# Patient Record
Sex: Female | Born: 1975
Health system: Southern US, Community
[De-identification: ages and names within clinical notes are randomized; demographics above are authoritative.]

## PROBLEM LIST (undated history)

## (undated) DIAGNOSIS — B019 Varicella without complication: Secondary | ICD-10-CM

## (undated) DIAGNOSIS — R1011 Right upper quadrant pain: Principal | ICD-10-CM

## (undated) DIAGNOSIS — G40909 Epilepsy, unspecified, not intractable, without status epilepticus: Secondary | ICD-10-CM

## (undated) DIAGNOSIS — K30 Functional dyspepsia: Secondary | ICD-10-CM

## (undated) DIAGNOSIS — R011 Cardiac murmur, unspecified: Secondary | ICD-10-CM

## (undated) DIAGNOSIS — G4700 Insomnia, unspecified: Secondary | ICD-10-CM

## (undated) DIAGNOSIS — K219 Gastro-esophageal reflux disease without esophagitis: Secondary | ICD-10-CM

## (undated) DIAGNOSIS — E785 Hyperlipidemia, unspecified: Secondary | ICD-10-CM

## (undated) DIAGNOSIS — J40 Bronchitis, not specified as acute or chronic: Principal | ICD-10-CM

## (undated) DIAGNOSIS — J45901 Unspecified asthma with (acute) exacerbation: Secondary | ICD-10-CM

## (undated) DIAGNOSIS — E663 Overweight: Secondary | ICD-10-CM

## (undated) DIAGNOSIS — B083 Erythema infectiosum [fifth disease]: Secondary | ICD-10-CM

## (undated) DIAGNOSIS — Z9889 Other specified postprocedural states: Secondary | ICD-10-CM

## (undated) DIAGNOSIS — F419 Anxiety disorder, unspecified: Secondary | ICD-10-CM

## (undated) HISTORY — DX: Anxiety disorder, unspecified: F41.9

## (undated) HISTORY — DX: Hyperlipidemia, unspecified: E78.5

## (undated) HISTORY — DX: Insomnia, unspecified: G47.00

## (undated) HISTORY — DX: Erythema infectiosum (fifth disease): B08.3

## (undated) HISTORY — DX: Gastro-esophageal reflux disease without esophagitis: K21.9

## (undated) HISTORY — PX: MYRINGOTOMY: SUR874

## (undated) HISTORY — DX: Right upper quadrant pain: R10.11

## (undated) HISTORY — DX: Cardiac murmur, unspecified: R01.1

## (undated) HISTORY — DX: Functional dyspepsia: K30

## (undated) HISTORY — DX: Overweight: E66.3

## (undated) HISTORY — DX: Other specified postprocedural states: Z98.890

## (undated) HISTORY — DX: Bronchitis, not specified as acute or chronic: J40

## (undated) HISTORY — PX: DILATION AND CURETTAGE OF UTERUS: SHX78

## (undated) HISTORY — DX: Epilepsy, unspecified, not intractable, without status epilepticus: G40.909

## (undated) HISTORY — PX: LIVER SURGERY: SHX698

## (undated) HISTORY — DX: Unspecified asthma with (acute) exacerbation: J45.901

## (undated) HISTORY — DX: Varicella without complication: B01.9

---
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
## 2008-01-30 ENCOUNTER — Encounter (INDEPENDENT_AMBULATORY_CARE_PROVIDER_SITE_OTHER): Payer: Self-pay | Admitting: *Deleted

## 2008-02-10 LAB — CONVERTED CEMR LAB: Pap Smear: NORMAL

## 2008-03-12 ENCOUNTER — Ambulatory Visit: Payer: Self-pay | Admitting: *Deleted

## 2008-03-12 DIAGNOSIS — J0110 Acute frontal sinusitis, unspecified: Secondary | ICD-10-CM

## 2008-03-12 HISTORY — DX: Acute frontal sinusitis, unspecified: J01.10

## 2008-05-02 ENCOUNTER — Emergency Department (HOSPITAL_BASED_OUTPATIENT_CLINIC_OR_DEPARTMENT_OTHER): Admission: EM | Admit: 2008-05-02 | Discharge: 2008-05-02 | Payer: Self-pay | Admitting: Emergency Medicine

## 2008-05-27 ENCOUNTER — Ambulatory Visit: Payer: Self-pay | Admitting: *Deleted

## 2008-05-27 DIAGNOSIS — S3210XA Unspecified fracture of sacrum, initial encounter for closed fracture: Secondary | ICD-10-CM | POA: Insufficient documentation

## 2008-05-27 DIAGNOSIS — S322XXA Fracture of coccyx, initial encounter for closed fracture: Secondary | ICD-10-CM

## 2008-05-27 HISTORY — DX: Unspecified fracture of sacrum, initial encounter for closed fracture: S32.10XA

## 2008-05-28 ENCOUNTER — Telehealth (INDEPENDENT_AMBULATORY_CARE_PROVIDER_SITE_OTHER): Payer: Self-pay | Admitting: *Deleted

## 2008-06-29 ENCOUNTER — Telehealth (INDEPENDENT_AMBULATORY_CARE_PROVIDER_SITE_OTHER): Payer: Self-pay | Admitting: *Deleted

## 2008-07-02 ENCOUNTER — Ambulatory Visit: Payer: Self-pay | Admitting: *Deleted

## 2008-08-18 ENCOUNTER — Ambulatory Visit: Payer: Self-pay | Admitting: *Deleted

## 2008-08-18 ENCOUNTER — Telehealth (INDEPENDENT_AMBULATORY_CARE_PROVIDER_SITE_OTHER): Payer: Self-pay | Admitting: *Deleted

## 2008-08-19 DIAGNOSIS — J45901 Unspecified asthma with (acute) exacerbation: Secondary | ICD-10-CM | POA: Insufficient documentation

## 2008-08-19 HISTORY — DX: Unspecified asthma with (acute) exacerbation: J45.901

## 2008-09-06 ENCOUNTER — Telehealth (INDEPENDENT_AMBULATORY_CARE_PROVIDER_SITE_OTHER): Payer: Self-pay | Admitting: *Deleted

## 2008-09-08 ENCOUNTER — Ambulatory Visit: Payer: Self-pay | Admitting: *Deleted

## 2008-09-08 ENCOUNTER — Ambulatory Visit: Payer: Self-pay | Admitting: Diagnostic Radiology

## 2008-09-08 ENCOUNTER — Ambulatory Visit (HOSPITAL_BASED_OUTPATIENT_CLINIC_OR_DEPARTMENT_OTHER): Admission: RE | Admit: 2008-09-08 | Discharge: 2008-09-08 | Payer: Self-pay | Admitting: *Deleted

## 2008-09-08 DIAGNOSIS — J309 Allergic rhinitis, unspecified: Secondary | ICD-10-CM | POA: Insufficient documentation

## 2008-10-25 ENCOUNTER — Ambulatory Visit: Payer: Self-pay | Admitting: *Deleted

## 2008-10-25 DIAGNOSIS — G44219 Episodic tension-type headache, not intractable: Secondary | ICD-10-CM

## 2008-10-25 HISTORY — DX: Episodic tension-type headache, not intractable: G44.219

## 2009-01-03 ENCOUNTER — Telehealth: Payer: Self-pay | Admitting: Family Medicine

## 2009-04-07 ENCOUNTER — Ambulatory Visit: Payer: Self-pay | Admitting: Family Medicine

## 2009-04-07 DIAGNOSIS — N3000 Acute cystitis without hematuria: Secondary | ICD-10-CM | POA: Insufficient documentation

## 2009-04-07 DIAGNOSIS — R002 Palpitations: Secondary | ICD-10-CM

## 2009-04-07 HISTORY — DX: Acute cystitis without hematuria: N30.00

## 2009-04-07 HISTORY — DX: Palpitations: R00.2

## 2009-04-07 LAB — CONVERTED CEMR LAB
Ketones, urine, test strip: NEGATIVE
Nitrite: NEGATIVE
Protein, U semiquant: NEGATIVE
Urobilinogen, UA: 0.2
WBC Urine, dipstick: NEGATIVE
pH: 5.5

## 2009-04-21 ENCOUNTER — Ambulatory Visit: Admission: RE | Admit: 2009-04-21 | Discharge: 2009-04-21 | Payer: Self-pay | Admitting: Family Medicine

## 2009-04-21 ENCOUNTER — Encounter: Payer: Self-pay | Admitting: Family Medicine

## 2009-06-24 ENCOUNTER — Ambulatory Visit: Payer: Self-pay | Admitting: Family Medicine

## 2009-06-24 LAB — CONVERTED CEMR LAB: Rapid Strep: NEGATIVE

## 2009-09-16 ENCOUNTER — Telehealth: Payer: Self-pay | Admitting: Family Medicine

## 2009-11-29 ENCOUNTER — Ambulatory Visit: Payer: Self-pay | Admitting: Family Medicine

## 2009-11-29 DIAGNOSIS — J069 Acute upper respiratory infection, unspecified: Secondary | ICD-10-CM | POA: Insufficient documentation

## 2009-12-08 ENCOUNTER — Emergency Department (HOSPITAL_COMMUNITY): Admission: EM | Admit: 2009-12-08 | Discharge: 2009-12-08 | Payer: Self-pay | Admitting: Emergency Medicine

## 2010-02-09 ENCOUNTER — Encounter: Payer: Self-pay | Admitting: Family Medicine

## 2010-02-10 HISTORY — PX: ANKLE SURGERY: SHX546

## 2010-03-08 ENCOUNTER — Encounter: Payer: Self-pay | Admitting: Family Medicine

## 2010-05-24 ENCOUNTER — Emergency Department (HOSPITAL_COMMUNITY): Admission: EM | Admit: 2010-05-24 | Discharge: 2010-05-24 | Payer: Self-pay | Admitting: Emergency Medicine

## 2010-08-10 ENCOUNTER — Telehealth: Payer: Self-pay | Admitting: Family Medicine

## 2010-08-10 ENCOUNTER — Encounter: Payer: Self-pay | Admitting: Family Medicine

## 2010-09-12 NOTE — Letter (Signed)
Summary: [facility]  [facility]   Imported By: Lanelle Bal 02/24/2010 12:47:59  _____________________________________________________________________  External Attachment:    Type:   Image     Comment:   External Document

## 2010-09-12 NOTE — Op Note (Signed)
Summary: Ankle Arthroscopy/[facility]  Ankle Arthroscopy/[facility]   Imported By: Lanelle Bal 03/17/2010 11:12:20  _____________________________________________________________________  External Attachment:    Type:   Image     Comment:   External Document

## 2010-09-12 NOTE — Progress Notes (Signed)
Summary: refill request CYCLOBENZAPRINE  Phone Note Refill Request Message from:  Fax from Pharmacy on September 16, 2009 8:27 AM  Refills Requested: Medication #1:  cyclobenzaprine 10 mg tablet  #20   Dosage confirmed as above?Dosage Confirmed   Brand Name Necessary? No   Supply Requested: 1 month   Last Refilled: 12/03/2008  Method Requested: Electronic Next Appointment Scheduled: none Initial call taken by: Roselle Locus,  September 16, 2009 8:29 AM  Follow-up for Phone Call        denied.  Needs OV with Dr Cathey Endow. Follow-up by: Seymour Bars DO,  September 17, 2009 11:13 AM

## 2010-09-12 NOTE — Assessment & Plan Note (Signed)
Summary: POSS SINUS INFECTION/TJ   Vital Signs:  Patient Profile:   35 Years Old Female CC:      Sinus pain and pressure in eye and left ear, coughing up green x 4 days Height:     62 inches Weight:      198 pounds O2 Sat:      98 % O2 treatment:    Room Air Temp:     98.7 degrees F oral Pulse rate:   78 / minute Pulse rhythm:   regular Resp:     16 per minute BP sitting:   126 / 82  (right arm) Cuff size:   regular  Vitals Entered By: Emilio Math (November 29, 2009 5:01 PM)                  Current Allergies: No known allergies History of Present Illness Chief Complaint: Sinus pain and pressure in eye and left ear, coughing up green x 4 days History of Present Illness: Subjective: Patient complains of URI symptoms for 3 days.  She has a history of asthma.  Patient requests refill of Flexeril which takes occasional for sore muscles after riding horses. No sore throat + cough No pleuritic pain No wheezing + nasal congestion + post-nasal drainage No sinus pain/pressure No itchy/red eyes No earache No hemoptysis No SOB but chest feels tight. No fever, + chills No nausea No vomiting No abdominal pain No diarrhea No skin rashes + fatigue No myalgias No headache    Current Meds ZOVIA 1/35E (28) 1-35 MG-MCG  TABS (ETHYNODIOL DIAC-ETH ESTRADIOL) Take 1 tablet by mouth once a day COMPLETE WOMENS   TABS (MULTIPLE VITAMINS-MINERALS) Take 1 tablet by mouth once a day PROAIR HFA 108 (90 BASE) MCG/ACT AERS (ALBUTEROL SULFATE) 1-2 puffs every 3-4 hours as needed shortness of breath ADVIL COLD/SINUS 30-200 MG TABS (PSEUDOEPHEDRINE-IBUPROFEN)  SUDAFED 30 MG TABS (PSEUDOEPHEDRINE HCL)  MUCINEX DM 30-600 MG XR12H-TAB (DEXTROMETHORPHAN-GUAIFENESIN)  AZITHROMYCIN 250 MG TABS (AZITHROMYCIN) Two tabs by mouth on day 1, then 1 tab daily on days 2 through 5 PREDNISONE 10 MG TABS (PREDNISONE) 2 PO today, then 2 BID for 2 days, then 1 two times a day for 2 days, then 1 daily for 2  days.  Take PC BENZONATATE 200 MG CAPS (BENZONATATE) One by mouth hs as needed cough CYCLOBENZAPRINE HCL 10 MG TABS (CYCLOBENZAPRINE HCL) One tab by mouth two to three times daily as needed  REVIEW OF SYSTEMS Constitutional Symptoms       Complains of fever, chills, and night sweats.     Denies weight loss, weight gain, and fatigue.  Eyes       Complains of eye pain.      Denies change in vision, eye discharge, glasses, contact lenses, and eye surgery. Ear/Nose/Throat/Mouth       Complains of ear pain, frequent runny nose, and sinus problems.      Denies hearing loss/aids, change in hearing, ear discharge, dizziness, frequent nose bleeds, sore throat, hoarseness, and tooth pain or bleeding.  Respiratory       Complains of productive cough.      Denies dry cough, wheezing, shortness of breath, asthma, bronchitis, and emphysema/COPD.  Cardiovascular       Complains of tires easily with exhertion.      Denies murmurs and chest pain.    Gastrointestinal       Denies stomach pain, nausea/vomiting, diarrhea, constipation, blood in bowel movements, and indigestion. Genitourniary  Denies painful urination, kidney stones, and loss of urinary control. Neurological       Complains of headaches.      Denies paralysis, seizures, and fainting/blackouts. Musculoskeletal       Complains of muscle pain and joint pain.      Denies joint stiffness, decreased range of motion, redness, swelling, muscle weakness, and gout.  Skin       Denies bruising, unusual mles/lumps or sores, and hair/skin or nail changes.  Psych       Denies mood changes, temper/anger issues, anxiety/stress, speech problems, depression, and sleep problems.  Past History:  Past Medical History: Reviewed history from 04/07/2009 and no changes required. Hx of  Depression Stress / tension headache - uses chiropracter with good relief - rare use of flexeril hyperlipidemia - diet controlled past h/o seizures - completely cleared   since 1994 - on no meds FRACTURE, COCCYX (ICD-805.6) Asthma Allergic rhinitis  Past Surgical History: Reviewed history from 03/12/2008 and no changes required. RMSF 12/2005 tonsils / adenoids 1996  Family History: Reviewed history from 04/07/2009 and no changes required. mother high chol, depression, PTSD, melanoma father high chol aunt high chol  only child aunt died of AMI at 13.  Social History: Reviewed history from 04/07/2009 and no changes required. Occupation: patient is an Charity fundraiser in Haematologist at [employer] Married to [place].  Has a 45 yo daughter [place]. Never smoked. 1 ETOH/ wk. Has a farm.  Rides horses and runs 4 x a wk. Pre - preg wt 165.     Objective:  No acute distress  Eyes:  Pupils are equal, round, and reactive to light and accomdation.  Extraocular movement is intact.  Conjunctivae are not inflamed.  Ears:  Canals normal.  Tympanic membranes normal.   Nose:  Normal septum.  Normal turbinates, mildly congested.  No sinus tenderness present.  Pharynx:  Normal  Neck:  Supple.  No adenopathy is present.  No thyromegaly is present  Lungs:  Clear to auscultation.  Breath sounds are equal.  Heart:  Regular rate and rhythm without murmurs, rubs, or gallops.  Abdomen:  Nontender without masses or hepatosplenomegaly.  Bowel sounds are present.  No CVA or flank tenderness.  Skin:  No rash Assessment New Problems: URI (ICD-465.9)  VIRAL URI WITH EARLY BRONCHOSPASM  Plan New Medications/Changes: CYCLOBENZAPRINE HCL 10 MG TABS (CYCLOBENZAPRINE HCL) One tab by mouth two to three times daily as needed  #20 x 0, 11/29/2009, Donna Christen MD BENZONATATE 200 MG CAPS (BENZONATATE) One by mouth hs as needed cough  #12 x 0, 11/29/2009, Donna Christen MD PREDNISONE 10 MG TABS (PREDNISONE) 2 PO today, then 2 BID for 2 days, then 1 two times a day for 2 days, then 1 daily for 2 days.  Take PC  #16 x 0, 11/29/2009, Donna Christen MD AZITHROMYCIN 250 MG TABS (AZITHROMYCIN) Two tabs by  mouth on day 1, then 1 tab daily on days 2 through 5  #6 tabs x 0, 11/29/2009, Donna Christen MD  New Orders: Est. Patient Level III [phone] Planning Comments:   Begin prednisone taper, Z-pack, expectorant, cough suppressant at night.  May continue ProAir as needed. Follow-up with PCP for persistent symptoms  Refill Flexeril   The patient and/or caregiver has been counseled thoroughly with regard to medications prescribed including dosage, schedule, interactions, rationale for use, and possible side effects and they verbalize understanding.  Diagnoses and expected course of recovery discussed and will return if not improved as expected or if the  condition worsens. Patient and/or caregiver verbalized understanding.  Prescriptions: CYCLOBENZAPRINE HCL 10 MG TABS (CYCLOBENZAPRINE HCL) One tab by mouth two to three times daily as needed  #20 x 0   Entered and Authorized by:   Donna Christen MD   Signed by:   Donna Christen MD on 11/29/2009   Method used:   Print then Give to Patient   RxID:   [id number] BENZONATATE 200 MG CAPS (BENZONATATE) One by mouth hs as needed cough  #12 x 0   Entered and Authorized by:   Donna Christen MD   Signed by:   Donna Christen MD on 11/29/2009   Method used:   Print then Give to Patient   RxID:   [id number] PREDNISONE 10 MG TABS (PREDNISONE) 2 PO today, then 2 BID for 2 days, then 1 two times a day for 2 days, then 1 daily for 2 days.  Take PC  #16 x 0   Entered and Authorized by:   Donna Christen MD   Signed by:   Donna Christen MD on 11/29/2009   Method used:   Print then Give to Patient   RxID:   [id number] AZITHROMYCIN 250 MG TABS (AZITHROMYCIN) Two tabs by mouth on day 1, then 1 tab daily on days 2 through 5  #6 tabs x 0   Entered and Authorized by:   Donna Christen MD   Signed by:   Donna Christen MD on 11/29/2009   Method used:   Print then Give to Patient   RxID:   [id number]   Patient Instructions: 1)  May use Mucinex D  (guaifenesin with decongestant) twice daily for congestion. 2)  Increase fluid intake  3)  Continue ProAir inhaler 4)  May use Afrin nasal spray (or generic oxymetazoline) twice daily for about 5 days.  Also recommend using saline nasal spray several times daily and/or saline nasal irrigation. 5)  Followup with family doctor if not improving 7 to 10 days.

## 2010-09-14 NOTE — Progress Notes (Signed)
Summary: Needs note to decline flu vaccine  Phone Note Call from Patient Call back at [phone]   Caller: Patient Call For: Ashley Bars DO Summary of Call: Pt calls and wants to know if should take flu shot. Required for work but when took last ear developed a large welp that was warm to touch and blistered out. Prefers not to take it but will need a note from you stating can not take the flu shot. Initial call taken by: Kathlene November LPN,  August 10, 2010 11:25 AM  Follow-up for Phone Call        letter printed. Follow-up by: Ashley Bars DO,  August 10, 2010 11:43 AM     Appended Document: Needs note to decline flu vaccine 08/10/2010- Pt notified can pick up note from office. KJ LPN

## 2010-09-14 NOTE — Letter (Signed)
Summary: Generic Letter  [facility]  [address], [address]   [place], [place] [address]   Phone: [phone]  Fax: [phone]    08/10/2010  Ashley Chang [address] [place], [place]  [address]  To Whom It May Concern,  Ms. Ashley Chang developed a localized reaction to the influenza vaccine in 2010 and should be exempt from administration of this year's flu shot for this reason.         Sincerely,    Seymour Bars DO

## 2010-09-18 ENCOUNTER — Other Ambulatory Visit: Payer: Self-pay | Admitting: Orthopedic Surgery

## 2010-09-18 ENCOUNTER — Ambulatory Visit
Admission: RE | Admit: 2010-09-18 | Discharge: 2010-09-18 | Disposition: A | Discharge status: Home / Self Care | Payer: Self-pay | Source: Ambulatory Visit | Attending: Orthopedic Surgery | Admitting: Orthopedic Surgery

## 2010-09-18 DIAGNOSIS — M25522 Pain in left elbow: Secondary | ICD-10-CM

## 2010-10-31 LAB — URINALYSIS, ROUTINE W REFLEX MICROSCOPIC
Bilirubin Urine: NEGATIVE
Ketones, ur: NEGATIVE mg/dL
Nitrite: NEGATIVE
Specific Gravity, Urine: 1.038 — ABNORMAL HIGH (ref 1.005–1.030)
Urobilinogen, UA: 0.2 mg/dL (ref 0.0–1.0)

## 2010-10-31 LAB — POCT I-STAT, CHEM 8
Calcium, Ion: 1.13 mmol/L (ref 1.12–1.32)
Creatinine, Ser: 1 mg/dL (ref 0.4–1.2)
Glucose, Bld: 83 mg/dL (ref 70–99)
HCT: 41 % (ref 36.0–46.0)
Hemoglobin: 13.9 g/dL (ref 12.0–15.0)
Potassium: 3.8 mEq/L (ref 3.5–5.1)
TCO2: 27 mmol/L (ref 0–100)

## 2010-10-31 LAB — POCT CARDIAC MARKERS
CKMB, poc: <1 ng/mL — ABNORMAL LOW (ref 1.0–8.0)
Myoglobin, poc: 47.5 ng/mL (ref 12–200)

## 2010-10-31 LAB — URINE MICROSCOPIC-ADD ON

## 2010-10-31 LAB — POCT PREGNANCY, URINE: Preg Test, Ur: NEGATIVE

## 2011-04-19 ENCOUNTER — Encounter: Payer: Self-pay | Admitting: Family Medicine

## 2011-04-19 ENCOUNTER — Other Ambulatory Visit: Payer: Self-pay | Admitting: Family Medicine

## 2011-04-19 ENCOUNTER — Ambulatory Visit (INDEPENDENT_AMBULATORY_CARE_PROVIDER_SITE_OTHER): Payer: BC Managed Care – PPO | Admitting: Family Medicine

## 2011-04-19 DIAGNOSIS — A779 Spotted fever, unspecified: Secondary | ICD-10-CM

## 2011-04-19 DIAGNOSIS — F411 Generalized anxiety disorder: Secondary | ICD-10-CM

## 2011-04-19 DIAGNOSIS — E785 Hyperlipidemia, unspecified: Secondary | ICD-10-CM

## 2011-04-19 DIAGNOSIS — A770 Spotted fever due to Rickettsia rickettsii: Secondary | ICD-10-CM

## 2011-04-19 DIAGNOSIS — F419 Anxiety disorder, unspecified: Secondary | ICD-10-CM

## 2011-04-19 DIAGNOSIS — E663 Overweight: Secondary | ICD-10-CM

## 2011-04-19 DIAGNOSIS — Z0000 Encounter for general adult medical examination without abnormal findings: Secondary | ICD-10-CM

## 2011-04-19 DIAGNOSIS — J309 Allergic rhinitis, unspecified: Secondary | ICD-10-CM

## 2011-04-19 DIAGNOSIS — G43909 Migraine, unspecified, not intractable, without status migrainosus: Secondary | ICD-10-CM

## 2011-04-19 DIAGNOSIS — J45909 Unspecified asthma, uncomplicated: Secondary | ICD-10-CM

## 2011-04-19 MED ORDER — ALBUTEROL SULFATE HFA 108 (90 BASE) MCG/ACT IN AERS: 2.0000 | INHALATION_SPRAY | Freq: Four times a day (QID) | RESPIRATORY_TRACT | Status: DC | PRN

## 2011-04-19 MED ORDER — RIZATRIPTAN BENZOATE 10 MG PO TBDP: 10.0000 mg | ORAL_TABLET | ORAL | Status: DC | PRN

## 2011-04-19 MED ORDER — CITALOPRAM HYDROBROMIDE 10 MG PO TABS: 10.0000 mg | ORAL_TABLET | Freq: Every day | ORAL | Status: DC

## 2011-04-19 MED ORDER — ALPRAZOLAM 0.25 MG PO TABS: 0.2500 mg | ORAL_TABLET | Freq: Three times a day (TID) | ORAL | Status: DC | PRN

## 2011-04-19 NOTE — Patient Instructions (Signed)
Anxiety and Panic Attacks Your caregiver has informed you that you are having an anxiety or panic attack. There may be many forms of this. Most of the time these attacks come suddenly and without warning. They come at any time of day, including periods of sleep, and at any time of life. They may be strong and unexplained. Although panic attacks are very scary, they are physically harmless. Sometimes the cause of your anxiety is not known. Anxiety is a protective mechanism of the body in its fight or flight mechanism. Most of these perceived danger situations are actually nonphysical situations (such as anxiety over losing a job). CAUSES The causes of an anxiety or panic attack are many. Panic attacks may occur in otherwise healthy people given a certain set of circumstances. There may be a genetic cause for panic attacks. Some medications may also have anxiety as a side effect. SYMPTOMS Some of the most common feelings are:  Intense terror.  Dizziness, feeling faint.   Hot and cold flashes.   Fear of going crazy.   Feelings that nothing is real.   Sweating.   Shaking.   Chest pain or a fast heartbeat (palpitations).  Smothering, choking sensations.   Feelings of impending doom and that death is near.   Tingling of extremities, this may be from over breathing.   Altered reality (derealization).   Being detached from yourself (depersonalization).   Several symptoms can be present to make up anxiety or panic attacks. DIAGNOSIS The evaluation by your caregiver will depend on the type of symptoms you are experiencing. The diagnosis of anxiety or pain attack is made when no physical illness can be determined to be a cause of the symptoms. TREATMENT Treatment to prevent anxiety and panic attacks may include:  Avoidance of circumstances that cause anxiety.   Reassurance and relaxation.   Regular exercise.   Relaxation therapies, such as yoga.   Psychotherapy with a psychiatrist  or therapist.   Avoidance of caffeine, alcohol and illegal drugs.   Prescribed medication.  SEEK IMMEDIATE MEDICAL CARE IF:  You experience panic attack symptoms that are different than your usual symptoms.   You have any worsening or concerning symptoms.  Document Released: 07/30/2005 Document Re-Released: 01/17/2010 [employer] Patient Information 2011 [employer]. 

## 2011-04-20 LAB — CBC WITH DIFFERENTIAL/PLATELET
Hemoglobin: 13.8 g/dL (ref 12.0–15.0)
Lymphocytes Relative: 33 % (ref 12–46)
Lymphs Abs: 2.9 10*3/uL (ref 0.7–4.0)
MCH: 32.1 pg (ref 26.0–34.0)
Monocytes Relative: 5 % (ref 3–12)
Neutro Abs: 5.5 10*3/uL (ref 1.7–7.7)
Neutrophils Relative %: 62 % (ref 43–77)
RBC: 4.3 MIL/uL (ref 3.87–5.11)

## 2011-04-20 LAB — LIPID PANEL
Total CHOL/HDL Ratio: 4.4 Ratio
VLDL: 20 mg/dL (ref 0–40)

## 2011-04-20 LAB — BASIC METABOLIC PANEL
CO2: 21 mEq/L (ref 19–32)
Chloride: 105 mEq/L (ref 96–112)
Glucose, Bld: 78 mg/dL (ref 70–99)
Potassium: 3.7 mEq/L (ref 3.5–5.3)
Sodium: 139 mEq/L (ref 135–145)

## 2011-04-20 LAB — HEPATIC FUNCTION PANEL
Albumin: 4.3 g/dL (ref 3.5–5.2)
Bilirubin, Direct: 0.1 mg/dL (ref 0.0–0.3)
Total Bilirubin: 0.6 mg/dL (ref 0.3–1.2)

## 2011-04-20 LAB — TSH: TSH: 1.953 u[IU]/mL (ref 0.350–4.500)

## 2011-04-20 LAB — PHOSPHORUS: Phosphorus: 3.3 mg/dL (ref 2.3–4.6)

## 2011-04-24 ENCOUNTER — Encounter: Payer: Self-pay | Admitting: Family Medicine

## 2011-04-24 DIAGNOSIS — F419 Anxiety disorder, unspecified: Secondary | ICD-10-CM

## 2011-04-24 DIAGNOSIS — Z9889 Other specified postprocedural states: Secondary | ICD-10-CM | POA: Insufficient documentation

## 2011-04-24 DIAGNOSIS — E785 Hyperlipidemia, unspecified: Secondary | ICD-10-CM | POA: Insufficient documentation

## 2011-04-24 DIAGNOSIS — B019 Varicella without complication: Secondary | ICD-10-CM | POA: Insufficient documentation

## 2011-04-24 DIAGNOSIS — B083 Erythema infectiosum [fifth disease]: Secondary | ICD-10-CM | POA: Insufficient documentation

## 2011-04-24 DIAGNOSIS — G40909 Epilepsy, unspecified, not intractable, without status epilepticus: Secondary | ICD-10-CM | POA: Insufficient documentation

## 2011-04-24 DIAGNOSIS — F418 Other specified anxiety disorders: Secondary | ICD-10-CM

## 2011-04-24 DIAGNOSIS — A770 Spotted fever due to Rickettsia rickettsii: Secondary | ICD-10-CM | POA: Insufficient documentation

## 2011-04-24 HISTORY — DX: Anxiety disorder, unspecified: F41.9

## 2011-04-24 HISTORY — DX: Other specified anxiety disorders: F41.8

## 2011-04-24 HISTORY — DX: Spotted fever due to Rickettsia rickettsii: A77.0

## 2011-04-24 HISTORY — DX: Hyperlipidemia, unspecified: E78.5

## 2011-04-24 NOTE — Assessment & Plan Note (Signed)
Has previously been on statins, will repeat levels before deciding on further treatments

## 2011-04-24 NOTE — Assessment & Plan Note (Addendum)
Patient with a strong family history and several months of worsening anxiety with panic attacks and palpitations at times. Is agreeable to starting medications. The shortness of these and use benzodiazepines only for breakthrough anxiety. She'll notify us if she has any troubles the Celexa up to now next visit. We will see her in followup to assess efficacy in 1 months time or as needed

## 2011-04-24 NOTE — Progress Notes (Signed)
Ashley Chang [id number] 1976-03-11 04/24/2011      Progress Note New Patient  Subjective  Chief Complaint  Chief Complaint  Patient presents with  . Establish Care    new patient/ transfer from Dr Cathey Endow High cholesterol    HPI  H&P is a 35 year old Caucasian female continuing to patient appointment. She is a Engineer, civil (consulting) in the hospital system and has a good social support at home. She does however have a strong family history of anxiety and depression he notes that over the last 3 months she's been having more episodes of palpitations and increased anxiety. She had some episodes of panic with difficulty breathing as well and is ready to try medications. She is not interested in counseling at this time and does feel medications will be helpful. She denies depression and feels this is worsening and he no recent or new triggers. No other recent illness, fevers, chills, chest pain, palpitations, shortness of breath, GI or GU complaints.  Past Medical History  Diagnosis Date  . Epilepsy     medically cleared and not followed by a physician for 16 yrs  . Fifth disease infant  . Chicken pox 7 yrs old  . Anxiety   . Hyperlipidemia 04/24/2011  . Hx of tympanostomy tubes     Past Surgical History  Procedure Date  . Right ankle surgery 2011    removal of tissue and reconstruction    Family History  Problem Relation Age of Onset  . Hypertension Mother   . Hyperlipidemia Father   . Dementia Maternal Grandmother   . Diabetes Paternal Grandmother   . Depression Paternal Grandfather     suicide    History   Social History  . Marital Status: Married    Spouse Name: N/A    Number of Children: N/A  . Years of Education: N/A   Occupational History  . Not on file.   Social History Main Topics  . Smoking status: Never Smoker   . Smokeless tobacco: Never Used  . Alcohol Use: Yes     occasionally  . Drug Use: No  . Sexually Active: Yes -- Female partner(s)   Other Topics Concern    . Not on file   Social History Narrative  . No narrative on file    No current outpatient prescriptions on file prior to visit.    No Known Allergies  Review of Systems  Review of Systems  Constitutional: Negative for fever, chills and malaise/fatigue.  HENT: Negative for hearing loss, nosebleeds and congestion.   Eyes: Negative for discharge.  Respiratory: Negative for cough, sputum production, shortness of breath and wheezing.   Cardiovascular: Negative for chest pain, palpitations and leg swelling.  Gastrointestinal: Negative for heartburn, nausea, vomiting, abdominal pain, diarrhea, constipation and blood in stool.  Genitourinary: Negative for dysuria, urgency, frequency and hematuria.  Musculoskeletal: Negative for myalgias, back pain and falls.  Skin: Negative for rash.  Neurological: Negative for dizziness, tremors, sensory change, focal weakness, loss of consciousness, weakness and headaches.  Endo/Heme/Allergies: Negative for polydipsia. Does not bruise/bleed easily.  Psychiatric/Behavioral: Negative for depression, suicidal ideas and substance abuse. The patient is nervous/anxious. The patient does not have insomnia.     Objective  BP 126/83  Pulse 66  Temp(Src) 98.1 F (36.7 C) (Oral)  Ht 5\' 2"  (1.575 m)  Wt 190 lb (86.183 kg)  BMI 34.75 kg/m2  SpO2 99%  LMP 04/10/2011  Physical Exam  Physical Exam  Constitutional: She is oriented to person, place,  and time and well-developed, well-nourished, and in no distress. No distress.  HENT:  Head: Normocephalic and atraumatic.  Right Ear: External ear normal.  Left Ear: External ear normal.  Nose: Nose normal.  Mouth/Throat: Oropharynx is clear and moist. No oropharyngeal exudate.  Eyes: Conjunctivae are normal. Pupils are equal, round, and reactive to light. Right eye exhibits no discharge. Left eye exhibits no discharge. No scleral icterus.  Neck: Normal range of motion. Neck supple. No thyromegaly present.   Cardiovascular: Normal rate, regular rhythm, normal heart sounds and intact distal pulses.   No murmur heard. Pulmonary/Chest: Effort normal and breath sounds normal. No respiratory distress. She has no wheezes. She has no rales.  Abdominal: Soft. Bowel sounds are normal. She exhibits no distension and no mass. There is no tenderness.  Musculoskeletal: Normal range of motion. She exhibits no edema and no tenderness.  Lymphadenopathy:    She has no cervical adenopathy.  Neurological: She is alert and oriented to person, place, and time. She has normal reflexes. No cranial nerve deficit. Coordination normal.  Skin: Skin is warm and dry. No rash noted. She is not diaphoretic.  Psychiatric: Mood, memory and affect normal.       Assessment & Plan  Anxiety Patient with a strong family history and several months of worsening anxiety with panic attacks and palpitations at times. Is agreeable to starting medications. The shortness of these and use benzodiazepines only for breakthrough anxiety. She'll notify us if she has any troubles the Celexa up to now next visit. We will see her in followup to assess efficacy in 1 months time or as needed  ALLERGIC RHINITIS Seasonal and responsive to over-the-counter antihistamines we'll continue this use when necessary  ASTHMA Mild, intermittent, use Albuterol prn  Hyperlipidemia Has previously been on statins, will repeat levels before deciding on further treatments

## 2011-04-24 NOTE — Assessment & Plan Note (Signed)
Mild, intermittent, use Albuterol prn

## 2011-04-24 NOTE — Assessment & Plan Note (Signed)
Seasonal and responsive to over-the-counter antihistamines we'll continue this use when necessary

## 2011-05-14 LAB — URINALYSIS, ROUTINE W REFLEX MICROSCOPIC
Bilirubin Urine: NEGATIVE
Nitrite: NEGATIVE
Protein, ur: NEGATIVE
Specific Gravity, Urine: 1.016
Urobilinogen, UA: 0.2

## 2011-05-21 ENCOUNTER — Encounter: Payer: Self-pay | Admitting: Family Medicine

## 2011-05-21 ENCOUNTER — Ambulatory Visit (INDEPENDENT_AMBULATORY_CARE_PROVIDER_SITE_OTHER): Payer: BC Managed Care – PPO | Admitting: Family Medicine

## 2011-05-21 DIAGNOSIS — E663 Overweight: Secondary | ICD-10-CM

## 2011-05-21 DIAGNOSIS — F411 Generalized anxiety disorder: Secondary | ICD-10-CM

## 2011-05-21 DIAGNOSIS — R002 Palpitations: Secondary | ICD-10-CM

## 2011-05-21 DIAGNOSIS — J45909 Unspecified asthma, uncomplicated: Secondary | ICD-10-CM

## 2011-05-21 DIAGNOSIS — F419 Anxiety disorder, unspecified: Secondary | ICD-10-CM

## 2011-05-21 DIAGNOSIS — E785 Hyperlipidemia, unspecified: Secondary | ICD-10-CM

## 2011-05-21 HISTORY — DX: Overweight: E66.3

## 2011-05-21 NOTE — Patient Instructions (Signed)
Hypercholesterolemia High Blood Cholesterol Cholesterol is a white, waxy, fat-like protein needed by your body in small amounts. The liver makes all the cholesterol you need. It is carried from the liver by the blood through the blood vessels. Deposits (plaque) may build up on blood vessel walls. This makes the arteries narrower and stiffer. Plaque increases the risk for heart attack and stroke. You cannot feel your cholesterol level even if it is very high. The only way to know is by a blood test to check your lipid (fats) levels. Once you know your cholesterol levels, you should keep a record of the test results. Work with your caregiver to to keep your levels in the desired range. WHAT THE RESULTS MEAN:  Total cholesterol is a rough measure of all the cholesterol in your blood.   LDL is the so-called bad cholesterol. This is the type that deposits cholesterol in the walls of the arteries. You want this level to be low.   HDL is the good cholesterol because it cleans the arteries and carries the LDL away. You want this level to be high.   Triglycerides are fat that the body can either burn for energy or store. High levels are closely linked to heart disease.  DESIRED LEVELS:  Total cholesterol below 200.   LDL below 100 for people at risk, below 70 for very high risk.   HDL above 50 is good, above 60 is best.   Triglycerides below 150.  HOW TO LOWER YOUR CHOLESTEROL:  Diet.   Exercise  Choose fish or white meat chicken and [place], roasted or baked. Limit fatty cuts of red meat, fried foods, and processed meats, such as sausage and lunch meat.   Eat lots of fresh fruits and vegetables. Choose whole grains, beans, pasta, potatoes and cereals.   Use only small amounts of olive, corn or canola oils. Avoid butter, mayonnaise, shortening or palm kernel oils. Avoid foods with trans-fats.   Use skim/nonfat milk and low-fat/nonfat yogurt and cheeses. Avoid whole milk, cream, ice cream, egg  yolks and cheeses. Healthy desserts include angel food cake, gingersnaps, animal crackers, hard candy, popsicles, and low-fat/nonfat frozen yogurt. Avoid pastries, cakes, pies and cookies.  A regular program helps decrease LDL and raises HDL.   Helps with weight control.   Do things that increase your activity level like gardening, walking, or taking the stairs.   Medication.   May be prescribed by your caregiver to help lowering cholesterol and the risk for heart disease.   You may need medicine even if your levels are normal if you have several risk factors.  HOME CARE INSTRUCTIONS  Follow your diet and exercise programs as suggested by your caregiver.   Take medications as directed.   Have blood work done when your caregiver feels it is necessary.  MAKE SURE YOU:   Understand these instructions.   Will watch your condition.   Will get help right away if you are not doing well or get worse.  Document Released: 07/30/2005 Document Re-Released: 07/12/2008 [facility] Patient Information 2011 [place], [place]. Avoid trans fats/partially hydrogenated oils Flaxseed oil caps daily

## 2011-05-21 NOTE — Assessment & Plan Note (Signed)
Had a recent URI and did have to use her inhaler during the worst of that but her symptoms have now resolved, she may continue prn use of Albuterol

## 2011-05-21 NOTE — Assessment & Plan Note (Signed)
She reports a good response to Citalopram, her mood is much calmer and she feels she is functioning well. The only down side is sexual dysfunction, but presently she feels this is worth the trade off. She is told she may consider cutting the Citalopram to 5mg  daily or if symptoms worsen can increase the dose to 20 mg and if she did that she would just need to notify us and make an appt for a couple weeks after the change so we could assess her response or consider switching meds altogether. Has only needed the Xanax a couple times but did not have a significant response only to 2 taken 2 hours  apart. She is advised to take 2 next time she has an anxiety attack and if no response can take another at the one hour mark

## 2011-05-21 NOTE — Progress Notes (Signed)
Ashley Chang [id number] 1976-03-11 05/21/2011      Progress Note New Patient  Subjective  Chief Complaint  Chief Complaint  Patient presents with  . Follow-up    1 month follow up    HPI  Patient is a 34 year old Caucasian female in today for followup on her new patient appointment. Her new patient appointment it was decided we will treat a history of anxiety disorder with medications. She started citalopram 10 mg daily and Xanax 0.25 mg when necessary. She reports a good response to the citalopram. She reports her mood has stabilized. She is less irritable and labile. And feels happier and more relaxed. She has had just a few episodes of anxiety attacks most notably when her daughter vomited in the car on the way of medication. When she took a Xanax 1 was not adequate. She waited 2 hours she did not want still did not have a significant response. She did not have any trouble with either medication. She denies any nausea vomiting, abdominal pain, hypersomnolence or difficulties with either medication. She does note however some mild sexual dysfunction and decreased interest since starting the citalopram for her it is worth the trade off at the present time. Her mother having tearing 24 7 for feeling grandmother and her feeling grandmother has now moved to [facility] with her and so the patient didn't notice her stress is somewhat better at this time. No fevers, chills, chest pain, palpitations, shortness of breath, GI or GU complaints at this time. She did have a recent URI which flared her asthma slightly. She needed albuterol twice a day for a few days and that has now resolved. She did try starting med trach will oil had some flatulence. It. She has started exercising at the gym routinely and finds that that is helping.  Past Medical History  Diagnosis Date  . Epilepsy     medically cleared and not followed by a physician for 16 yrs  . Fifth disease infant  . Chicken pox 7 yrs old  .  Anxiety   . Hyperlipidemia 04/24/2011  . Hx of tympanostomy tubes   . Overweight 05/21/2011    Past Surgical History  Procedure Date  . Right ankle surgery 2011    removal of tissue and reconstruction    Family History  Problem Relation Age of Onset  . Hypertension Mother   . Hyperlipidemia Father   . Dementia Maternal Grandmother   . Diabetes Paternal Grandmother   . Depression Paternal Grandfather     suicide    History   Social History  . Marital Status: Married    Spouse Name: N/A    Number of Children: N/A  . Years of Education: N/A   Occupational History  . Not on file.   Social History Main Topics  . Smoking status: Never Smoker   . Smokeless tobacco: Never Used  . Alcohol Use: Yes     occasionally  . Drug Use: No  . Sexually Active: Yes -- Female partner(s)   Other Topics Concern  . Not on file   Social History Narrative  . No narrative on file    Current Outpatient Prescriptions on File Prior to Visit  Medication Sig Dispense Refill  . albuterol (PROAIR HFA) 108 (90 BASE) MCG/ACT inhaler Inhale 2 puffs into the lungs every 6 (six) hours as needed for wheezing.  1 Inhaler  5  . ALPRAZolam (XANAX) 0.25 MG tablet Take 1 tablet (0.25 mg total) by mouth 3 (three)  times daily as needed for anxiety.  30 tablet  0  . Artificial Tear Ointment (ARTIFICIAL TEARS) ointment Place into both eyes as needed.        . citalopram (CELEXA) 10 MG tablet Take 1 tablet (10 mg total) by mouth daily.  30 tablet  2  . ethynodiol-ethinyl estradiol (ZOVIA 1/35E, 28,) 1-35 MG-MCG tablet Take 1 tablet by mouth daily.        Marland Kitchen HYDROcodone-acetaminophen (NORCO) 5-325 MG per tablet Take 1 tablet by mouth as needed.        Marland Kitchen ibuprofen (ADVIL,MOTRIN) 200 MG tablet Take 400 mg by mouth as needed.        . Multiple Vitamin (MULTIVITAMIN) tablet Take 1 tablet by mouth daily.        . rizatriptan (MAXALT-MLT) 10 MG disintegrating tablet Take 1 tablet (10 mg total) by mouth as needed for  migraine. May repeat in 2 hours if needed  2 tablet  0  . cyclobenzaprine (FLEXERIL) 5 MG tablet Take 5 mg by mouth as needed.          No Known Allergies  Review of Systems  Review of Systems  Constitutional: Negative for fever and malaise/fatigue.  HENT: Negative for congestion.   Eyes: Negative for discharge.  Respiratory: Negative for shortness of breath.   Cardiovascular: Negative for chest pain, palpitations and leg swelling.  Gastrointestinal: Negative for nausea, abdominal pain and diarrhea.  Genitourinary: Negative for dysuria.  Musculoskeletal: Negative for falls.  Skin: Negative for rash.  Neurological: Negative for loss of consciousness and headaches.  Endo/Heme/Allergies: Negative for polydipsia.  Psychiatric/Behavioral: Negative for depression and suicidal ideas. The patient is nervous/anxious. The patient does not have insomnia.     Objective  BP 138/82  Pulse 71  Temp(Src) 98.3 F (36.8 C) (Oral)  Ht 5\' 2"  (1.575 m)  Wt 189 lb 6.4 oz (85.911 kg)  BMI 34.64 kg/m2  SpO2 100%  LMP 05/10/2011  Physical Exam  Physical Exam  Constitutional: She is oriented to person, place, and time and well-developed, well-nourished, and in no distress. No distress.  HENT:  Head: Normocephalic and atraumatic.  Eyes: Conjunctivae are normal.  Neck: Neck supple. No thyromegaly present.  Cardiovascular: Normal rate, regular rhythm and normal heart sounds.   No murmur heard. Pulmonary/Chest: Effort normal and breath sounds normal. She has no wheezes.  Abdominal: She exhibits no distension and no mass.  Musculoskeletal: She exhibits no edema.  Lymphadenopathy:    She has no cervical adenopathy.  Neurological: She is alert and oriented to person, place, and time.  Skin: Skin is warm and dry. No rash noted. She is not diaphoretic.  Psychiatric: Memory, affect and judgment normal.       Assessment & Plan  PALPITATIONS No recurrent episodes except some mild palpitations  during an anxiety attack  Hyperlipidemia Reviewed labs with patient, encouraged to avoid trans fats, has just started exercising, encouraged to continue. Did not tolerated MegaRed, made her gassy, encouraged to try flaxseed oil caps or grind them and take it that way daily.  Anxiety She reports a good response to Citalopram, her mood is much calmer and she feels she is functioning well. The only down side is sexual dysfunction, but presently she feels this is worth the trade off. She is told she may consider cutting the Citalopram to 5mg  daily or if symptoms worsen can increase the dose to 20 mg and if she did that she would just need to notify us and make  an appt for a couple weeks after the change so we could assess her response or consider switching meds altogether. Has only needed the Xanax a couple times but did not have a significant response only to 2 taken 2 hours  apart. She is advised to take 2 next time she has an anxiety attack and if no response can take another at the one hour mark  Overweight Weight is stable, she is encouraged to continue with her exercise regimen and decrease her po intake  ASTHMA Had a recent URI and did have to use her inhaler during the worst of that but her symptoms have now resolved, she may continue prn use of Albuterol

## 2011-05-21 NOTE — Assessment & Plan Note (Signed)
Reviewed labs with patient, encouraged to avoid trans fats, has just started exercising, encouraged to continue. Did not tolerated MegaRed, made her gassy, encouraged to try flaxseed oil caps or grind them and take it that way daily.

## 2011-05-21 NOTE — Assessment & Plan Note (Signed)
Weight is stable, she is encouraged to continue with her exercise regimen and decrease her po intake

## 2011-05-21 NOTE — Assessment & Plan Note (Signed)
No recurrent episodes except some mild palpitations during an anxiety attack

## 2011-06-29 ENCOUNTER — Other Ambulatory Visit: Payer: Self-pay

## 2011-06-29 DIAGNOSIS — F419 Anxiety disorder, unspecified: Secondary | ICD-10-CM

## 2011-06-29 MED ORDER — ALPRAZOLAM 0.25 MG PO TABS: 0.2500 mg | ORAL_TABLET | Freq: Three times a day (TID) | ORAL | Status: DC | PRN

## 2011-06-29 NOTE — Telephone Encounter (Signed)
Faxed to pharmacy

## 2011-06-29 NOTE — Telephone Encounter (Signed)
Please advise refill? 

## 2011-07-12 ENCOUNTER — Other Ambulatory Visit: Payer: Self-pay | Admitting: Family Medicine

## 2011-08-06 ENCOUNTER — Telehealth: Payer: Self-pay

## 2011-08-06 NOTE — Telephone Encounter (Signed)
[facility] Triage Call Report Triage Record Num: [id number] Operator: Patriciaann Clan Patient Name: Ashley Chang Call Date & Time: 08/04/2011 12:23:44PM Patient Phone: [phone] PCP: Darrow Bussing Patient Gender: Female PCP Fax : [phone] Patient DOB: [birth year] [facility] Name: [facility] Reason for Call: Caller: [name]/Patient; PCP: Danise Edge; CB#: [phone]; Call Reason: Headache; LMP 07/31/11. Patient calling. States she has hx of Migraine Headaches. States developed migraine headache, onset 08/02/11. States has been taking Ibuprofen 800mg . q 8 hours for pain with sopme mprovement. States pain @ " 5-6" on 1-10 scale. States pain accompanied by nausea. States emesis X 1 08/04/11. Afebrile. No nuccal rigidity. Triage per Headache Protocol. No emergent sx identified. Care advice given per guidelines. Patient advised dark, quiet room, samll amounts of clear fluids frequently. Ibuprofen 800mg . q 8 hours prn with food. Call back parameters reviewed. Call transferred to [facility] in office for appt. No appts. available. Patient requesting Zofran for nausea. Rx for Zofran 4mg .-8mg .p.o. q 8 hours for nausea. Dispensense # 6, no refills called into [facility] on [address] @ [phone]. Spoke with Pharmacist/Michael. Patient advised of above, call back parameters reviewed. Patient advised to be seen within 24 hours if no improvement in sx. Patient verbalizes understanding. Protocol(s) Used: Headache Recommended Outcome per Protocol: See Provider within 24 hours Reason for Outcome: Typical headache AND usual therapy is not available or is not working Care Advice: ~ Another adult should drive. ~ Do not drink alcoholic beverages or use tobacco products. ~ Avoid known causes and factors that trigger headaches. ~ Do not skip or delay meals, unless vomiting. ~ Call provider if symptoms worsen or new symptoms develop. ~ SYMPTOM / CONDITION MANAGEMENT ~ List, or take, all current  prescription(s), nonprescription or alternative medication(s) to provider for evaluation. Most adults need to drink 6-10 eight-ounce glasses (1.2-2.0 liters) of fluids per day unless previously told to limit fluid intake for other medical reasons. Limit fluids that contain caffeine, sugar or alcohol. Urine will be a very light yellow color when you drink enough fluids. ~ Analgesic/Antipyretic Advice - Acetaminophen: Consider acetaminophen as directed on label or by pharmacist/provider for pain or fever PRECAUTIONS: - Use if there is no history of liver disease, alcoholism, or intake of three or more alcohol drinks per day - Only if approved by provider during pregnancy or when breastfeeding - During pregnancy, acetaminophen should not be taken more than 3 consecutive days without telling provider - Do not exceed recommended dose or frequency ~ Analgesic/Antipyretic Advice - NSAIDs: Consider aspirin, ibuprofen, naproxen or ketoprofen for pain or fever as directed on label or by pharmacist/provider. PRECAUTIONS: - If over 64 years of age, should not take longer than 1 week without consulting provider. ~ 08/04/2011 12:49:31PM Page 1 of 2 [id number] [facility] Triage Call Report Patient Name: Kyleah Pensabene continuation page/s EXCEPTIONS: - Should not be used if taking blood thinners or have bleeding problems. - Do not use if have history of sensitivity/allergy to any of these medications; or history of cardiovascular, ulcer, kidney, liver disease or diabetes unless approved by provider. - Do not exceed recommended dose or frequency. Migraine Self Care: At the first sign of a migraine apply a cold cloth or cloth-covered ice pack to your head or the back of the neck. - Apply pressure or massage scalp and temples. Lie down in a quiet, dark room for several hours. - Minimize noise, light, and odors, especially cooking and tobacco odors. - Do not read or use a computer. ~ 12/

## 2011-09-10 ENCOUNTER — Ambulatory Visit: Payer: BC Managed Care – PPO | Admitting: Family Medicine

## 2011-09-12 ENCOUNTER — Ambulatory Visit (INDEPENDENT_AMBULATORY_CARE_PROVIDER_SITE_OTHER): Payer: BC Managed Care – PPO | Admitting: Family Medicine

## 2011-09-12 ENCOUNTER — Encounter: Payer: Self-pay | Admitting: Family Medicine

## 2011-09-12 DIAGNOSIS — K3189 Other diseases of stomach and duodenum: Secondary | ICD-10-CM

## 2011-09-12 DIAGNOSIS — R011 Cardiac murmur, unspecified: Secondary | ICD-10-CM

## 2011-09-12 DIAGNOSIS — K30 Functional dyspepsia: Secondary | ICD-10-CM

## 2011-09-12 DIAGNOSIS — F419 Anxiety disorder, unspecified: Secondary | ICD-10-CM

## 2011-09-12 DIAGNOSIS — F411 Generalized anxiety disorder: Secondary | ICD-10-CM

## 2011-09-12 DIAGNOSIS — R002 Palpitations: Secondary | ICD-10-CM

## 2011-09-12 DIAGNOSIS — R0789 Other chest pain: Secondary | ICD-10-CM

## 2011-09-12 HISTORY — DX: Functional dyspepsia: K30

## 2011-09-12 HISTORY — DX: Cardiac murmur, unspecified: R01.1

## 2011-09-12 MED ORDER — ALPRAZOLAM 0.25 MG PO TABS: 0.2500 mg | ORAL_TABLET | Freq: Three times a day (TID) | ORAL | Status: DC | PRN

## 2011-09-12 NOTE — Assessment & Plan Note (Signed)
Recently had a more prolonged episode while at work. 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 Her symptoms lasted nearly an hour and then resolved spontaneously. They have not recurred this week. She brings in some paper copies of some EKGs they did during that time which show sinus rhythm, the poorest quality one did get interpreted as prolonged QT but the baseline was very unsteady. Due to the symptomatic nature of her episode and the strenuous nature of her work will have her evaluated by cardiology for consideration of an event monitor.

## 2011-09-12 NOTE — Assessment & Plan Note (Signed)
Patient has been struggling with increased gaseousness and intermittent discomfort since returning home from a trip to [place], encouraged to add a probiotic and a yogurt with benefiber to her daily regimen and maintain 64 oz of clear fluids daily. Report worsening symptoms for further evaluation

## 2011-09-12 NOTE — Progress Notes (Signed)
Patient ID: Ashley Chang, female   DOB: 12-10-1975, 36 y.o.   MRN: [id number] Ashley Chang [id number] 1975/09/25 09/12/2011      Progress Note-Follow Up  Subjective  Chief Complaint  Chief Complaint  Patient presents with  . Abdominal Pain    X   days    HPI  Patient is a 36 year old Caucasian female who is in today for evaluation of several concerns. A long-standing history of intermittent palpitations they generally don't have associated symptoms nor do they usually last for any length of time. Should her recent episode while at work of prolonged palpitations with some chest discomfort. 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 They ran some EKGs on the floor the looked essentially normal but her symptoms lasted for over an hour. She denies any actual diaphoresis nausea or obvious shortness of breath during this episode and reports the symptoms stopped as abruptly as they began. After they resolved she reports she fell while except for potentially slightly increased fatigue for the next 24 hours. She had been working a night shift so so she's not sure if it was a shift or secondary to the episode. She also was recently returned from a trip the [place] and has been having some GI upset and abdominal discomfort intermittently since then. Her bowels have been moving more softly and more free clinic and usual but no bloody or runny stool. She has had some mild diffuse tenderness. She's noted some increased gaseousness and malodorous symptoms. She denies nausea vomiting or heartburn. She stopped Celexa back in December and does not feel like she has worsened. She has been using her alprazolam intermittently but infrequently less than once a week for moments of excessive anxiety but these have become less frequent. No severe depression, no anxiety attacks  and no suicidal ideation noted  Past Medical History  Diagnosis Date  . Epilepsy     medically cleared and not followed by a physician for 16 yrs  . Fifth disease infant  . Chicken pox 7 yrs old  . Anxiety   . Hyperlipidemia 04/24/2011  . Hx of tympanostomy tubes   . Overweight 05/21/2011  . Newly recognized heart murmur 09/12/2011  . Functional dyspepsia 09/12/2011    Past Surgical History  Procedure Date  . Right ankle surgery 2011    removal of tissue and reconstruction    Family History  Problem Relation Age of Onset  . Hypertension Mother   . Hyperlipidemia Father   . Dementia Maternal Grandmother   . Diabetes Paternal Grandmother   . Depression Paternal Grandfather     suicide    History   Social History  . Marital Status: Married    Spouse Name: N/A    Number of Children: N/A  . Years of Education: N/A   Occupational History  . Not on file.   Social History Main Topics  . Smoking status: Never Smoker   . Smokeless tobacco: Never Used  . Alcohol Use: Yes     occasionally  . Drug Use: No  . Sexually Active: Yes -- Female partner(s)   Other Topics Concern  . Not on file   Social History Narrative  . No narrative on file    Current Outpatient Prescriptions on File Prior to Visit  Medication Sig Dispense Refill  . albuterol (PROAIR HFA)  108 (90 BASE) MCG/ACT inhaler Inhale 2 puffs into the lungs every 6 (six) hours as needed for wheezing.  1 Inhaler  5  . Artificial Tear Ointment (ARTIFICIAL TEARS) ointment Place into both eyes as needed.        . cyclobenzaprine (FLEXERIL) 5 MG tablet Take 5 mg by mouth as needed.        . ethynodiol-ethinyl estradiol (ZOVIA 1/35E, 28,) 1-35 MG-MCG tablet Take 1 tablet by mouth daily.        Marland Kitchen ibuprofen (ADVIL,MOTRIN) 200 MG tablet Take 400 mg by mouth as needed.        . Multiple Vitamin (MULTIVITAMIN) tablet Take 1 tablet by mouth daily.        Marland Kitchen HYDROcodone-acetaminophen (NORCO) 5-325 MG per tablet Take 1 tablet by  mouth as needed.        . rizatriptan (MAXALT-MLT) 10 MG disintegrating tablet Take 1 tablet (10 mg total) by mouth as needed for migraine. May repeat in 2 hours if needed  2 tablet  0    No Known Allergies  Review of Systems  Review of Systems  Constitutional: Negative for fever and malaise/fatigue.  HENT: Negative for congestion.   Eyes: Negative for discharge.  Respiratory: Negative for shortness of breath.   Cardiovascular: Positive for chest pain and palpitations. Negative for leg swelling.       Prolonged episode recently  Gastrointestinal: Positive for abdominal pain. Negative for heartburn, nausea, vomiting, diarrhea, constipation, blood in stool and melena.       Increased malodorous gaseousness  Genitourinary: Negative for dysuria.  Musculoskeletal: Negative for falls.  Skin: Negative for rash.  Neurological: Negative for loss of consciousness and headaches.  Endo/Heme/Allergies: Negative for polydipsia.  Psychiatric/Behavioral: Negative for depression and suicidal ideas. The patient is not nervous/anxious and does not have insomnia.     Objective  BP 123/79  Pulse 76  Temp(Src) 98.5 F (36.9 C) (Temporal)  Ht 5\' 2"  (1.575 m)  Wt 198 lb (89.812 kg)  BMI 36.21 kg/m2  SpO2 97%  LMP 08/28/2011  Physical Exam  Physical Exam  Constitutional: She is oriented to person, place, and time and well-developed, well-nourished, and in no distress. No distress.  HENT:  Head: Normocephalic and atraumatic.  Eyes: Conjunctivae are normal.  Neck: Neck supple. No thyromegaly present.  Cardiovascular: Normal rate, regular rhythm and normal heart sounds.   No murmur heard.      Did have 1 ectopic beat during auscultation  Pulmonary/Chest: Effort normal and breath sounds normal. She has no wheezes.  Abdominal: She exhibits no distension and no mass.  Musculoskeletal: She exhibits no edema.  Lymphadenopathy:    She has no cervical adenopathy.  Neurological: She is alert and  oriented to person, place, and time.  Skin: Skin is warm and dry. No rash noted. She is not diaphoretic.  Psychiatric: Memory, affect and judgment normal.    Lab Results  Component Value Date   TSH 1.953 04/19/2011   Lab Results  Component Value Date   WBC 8.8 04/19/2011   HGB 13.8 04/19/2011   HCT 41.8 04/19/2011   MCV 97.2 04/19/2011   PLT 235 04/19/2011   Lab Results  Component Value Date   CREATININE 0.83 04/19/2011   BUN 21 04/19/2011   NA 139 04/19/2011   K 3.7 04/19/2011   CL 105 04/19/2011   CO2 21 04/19/2011   Lab Results  Component Value Date   ALT 15 04/19/2011   AST 16 04/19/2011  ALKPHOS 46 04/19/2011   BILITOT 0.6 04/19/2011   Lab Results  Component Value Date   CHOL 249* 04/19/2011   Lab Results  Component Value Date   HDL 56 04/19/2011   Lab Results  Component Value Date   LDLCALC 173* 04/19/2011   Lab Results  Component Value Date   TRIG 101 04/19/2011   Lab Results  Component Value Date   CHOLHDL 4.4 04/19/2011     Assessment & Plan  PALPITATIONS Recently had a more prolonged episode while at work. 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 Her symptoms lasted nearly an hour and then resolved spontaneously. They have not recurred this week. She brings in some paper copies of some EKGs they did during that time which show sinus rhythm, the poorest quality one did get interpreted as prolonged QT but the baseline was very unsteady. Due to the symptomatic nature of her episode and the strenuous nature of her work will have her evaluated by cardiology for consideration of an event monitor.  Newly recognized heart murmur Slight systolic murmur noted today, will check an echo to evaluate  Functional dyspepsia Patient has been struggling with increased gaseousness and intermittent discomfort since returning home from a trip to [place], encouraged to  add a probiotic and a yogurt with benefiber to her daily regimen and maintain 64 oz of clear fluids daily. Report worsening symptoms for further evaluation

## 2011-09-12 NOTE — Assessment & Plan Note (Signed)
Slight systolic murmur noted today, will check an echo to evaluate

## 2011-09-12 NOTE — Patient Instructions (Signed)
Heart Murmur A heart murmur is an extra sound heard by your caregiver when listening to your heart with a device called a stethoscope. The sound might be a "hum" or "whoosh" sound heard when the heart beats. The sound comes from turbulence when blood flows through the heart. There are two types of heart murmurs:  Innocent (Harmless) murmurs: Most people with this type of heart murmur do not have signs or symptoms of heart problems. Many children have innocent heart murmurs. When an innocent heart murmur is found, there is no need to get tests or do treatment. Also, there is no need to restrict activities or stop playing sports. Innocent heart murmurs may be caused by many things. For example, it might be caused by a tiny hole or defect in the wall of the heart. These defects often close as a child grows. An innocent heart murmur may be heard by an examining clinician throughout your life. If you see a new caregiver, please let him or her know this was found during past exams.   Abnormal murmurs: May have signs and symptoms of heart problems. These types of murmurs can occur in children and adults. In children, abnormal heart murmurs are typically caused from heart defects that are present at birth. In adults, abnormal murmurs are usually from heart valve problems caused by disease, infection, or aging.  SYMPTOMS   Innocent (Harmless) murmurs do not cause symptoms or require you to limit physical activity.   Many people with abnormal murmurs may or may not have symptoms. If symptoms do develop, they might include:   Shortness of breath.   Blue coloring of the skin, especially on the fingertips.   Chest pain.   Palpitations or feeling a "fluttering" or a "skipped" heart beat.   Fainting.   Persistent cough.   Getting tired much faster than expected.  DIAGNOSIS  A heart murmur might be heard during a pre-sports physical or during any type of examination. When a murmur is heard, it may suggest  a possible problem. When this happens, your caregiver may ask you to see a heart specialist (cardiologist). You may also be asked to undergo one or more heart tests. In these cases, testing may vary depending upon what your caregiver heard. Tests for a heart murmur might include one or more of the following:  EKG (electrocardiogram).   Echocardiogram.   Cardiac MRI.  For children and adults who have an abnormal heart murmur and want to play sports, it is important to complete testing, review test results, and receive recommendations from your caregiver. If heart disease is present, it may be risky to play. Finding out the results of your test Not all test results are available during your visit. If your test results are not back during the visit, make an appointment with your caregiver to find out the results. Do not assume everything is normal if you have not heard from your caregiver or the medical facility. It is important for you to follow up on all of your test results.  TREATMENT  As noted above, innocent (harmless) murmurs require no treatment or activity restriction. If the murmur represents a problem with the heart, treatment will depend upon the exact nature of the problem. In these cases, medicine or surgery may be needed to treat the problem. HOME CARE INSTRUCTIONS If you want to participate in sports or other types of strenuous physical activity, it is important to discuss this first with your caregiver. If the murmur represents a  problem with the heart and you choose to participate in sports, there is a small chance that a serious problem (including sudden death) could result.  SEEK MEDICAL CARE IF:   You feel that your symptoms are slowly worsening.   You develop any new symptoms that cause concern.   You feel that you are having side effects from any medicines prescribed.  SEEK IMMEDIATE MEDICAL CARE IF:   Chest pain develops.   You are short of breath.   You notice that  your heart beats irregularly often enough to cause you to worry.   You have fainting spells.   There is a worsening of any problems that brought you or your child in for medical care.  Document Released: 09/06/2004 Document Revised: 04/11/2011 Document Reviewed: 10/07/2007 [facility] Patient Information 2012 [place], [place]. Avoid all caffeine, seek immediate care if symptoms worsen or occur and do not resolve  For the GI upset start a yogurt with 2 tsp of Benefiber powder in it daily, maintain 64 oz of clear fluids and add a probiotic cap such as Align caps daily for next month

## 2011-09-18 ENCOUNTER — Ambulatory Visit (HOSPITAL_COMMUNITY): Payer: BC Managed Care – PPO | Attending: Cardiology | Admitting: Radiology

## 2011-09-18 DIAGNOSIS — R002 Palpitations: Secondary | ICD-10-CM | POA: Insufficient documentation

## 2011-09-18 DIAGNOSIS — R0789 Other chest pain: Secondary | ICD-10-CM

## 2011-09-18 DIAGNOSIS — R011 Cardiac murmur, unspecified: Secondary | ICD-10-CM

## 2011-09-18 DIAGNOSIS — R079 Chest pain, unspecified: Secondary | ICD-10-CM | POA: Insufficient documentation

## 2011-09-19 ENCOUNTER — Telehealth: Payer: Self-pay

## 2011-09-19 NOTE — Telephone Encounter (Signed)
Patient left a message stating she would like to know if MD still wants her to follow up with Cardiology next week? Please advise?

## 2011-09-19 NOTE — Telephone Encounter (Signed)
Cardiology will also evaluate her palpitations so it is up to her if her palpitations are manageable we can just watch them here

## 2011-09-20 NOTE — Telephone Encounter (Signed)
Left detailed message on cell phone

## 2011-09-28 ENCOUNTER — Encounter: Payer: Self-pay | Admitting: Internal Medicine

## 2011-09-28 ENCOUNTER — Ambulatory Visit (INDEPENDENT_AMBULATORY_CARE_PROVIDER_SITE_OTHER): Payer: BC Managed Care – PPO | Admitting: Internal Medicine

## 2011-09-28 VITALS — BP 140/90 | HR 70 | Ht 62.0 in | Wt 198.0 lb

## 2011-09-28 DIAGNOSIS — E785 Hyperlipidemia, unspecified: Secondary | ICD-10-CM

## 2011-09-28 DIAGNOSIS — S3210XA Unspecified fracture of sacrum, initial encounter for closed fracture: Secondary | ICD-10-CM

## 2011-09-28 DIAGNOSIS — J45909 Unspecified asthma, uncomplicated: Secondary | ICD-10-CM

## 2011-09-28 DIAGNOSIS — R002 Palpitations: Secondary | ICD-10-CM

## 2011-09-28 NOTE — Progress Notes (Signed)
HPI Patient was referred by Dr. Abner Greenspan.  Has long history of intermitt palpitations.  Occasionally associated with chest discomfort   Patient had echo on 2/5 that showed normal LV systolic function  Triv TR. Notes episode at work. Sitting at computer.  Felt heart racing.  Got dizzy.  Weak.  Got chest discomfort with .  Eased off some by the time hooked up to EKG machine.  On hook up  HR 60s to 80.   Felt oK later. Since then has had other spells lasting only 2 to 3 minutes.  Not to that magnitude however. No passing out. Some days can run, ride horses.  Does great.   Other days short winded. Started in Fall  No illness around that time.   Hx maternal aunt who died suddenly   "Blew an artery to her heart" Heart palpitations in past with no SOB or pain associated with this.  Shorter lived. No Known Allergies  Current Outpatient Prescriptions  Medication Sig Dispense Refill  . albuterol (PROAIR HFA) 108 (90 BASE) MCG/ACT inhaler Inhale 2 puffs into the lungs every 6 (six) hours as needed for wheezing.  1 Inhaler  5  . ALPRAZolam (XANAX) 0.25 MG tablet Take 1 tablet (0.25 mg total) by mouth 3 (three) times daily as needed for anxiety.  30 tablet  2  . Artificial Tear Ointment (ARTIFICIAL TEARS) ointment Place into both eyes as needed.        . cyclobenzaprine (FLEXERIL) 5 MG tablet Take 5 mg by mouth as needed.        . ethynodiol-ethinyl estradiol (ZOVIA 1/35E, 28,) 1-35 MG-MCG tablet Take 1 tablet by mouth daily.        Marland Kitchen FIBER, GUAR GUM, PO Take 5 tablets by mouth daily.      . Flaxseed, Linseed, (FLAX SEEDS PO) Take 1 tablet by mouth daily.      Marland Kitchen HYDROcodone-acetaminophen (NORCO) 5-325 MG per tablet Take 1 tablet by mouth as needed.        Marland Kitchen ibuprofen (ADVIL,MOTRIN) 200 MG tablet Take 400 mg by mouth as needed.        . Multiple Vitamin (MULTIVITAMIN) tablet Take 1 tablet by mouth daily.        . rizatriptan (MAXALT-MLT) 10 MG disintegrating tablet Take 1 tablet (10 mg total) by mouth as  needed for migraine. May repeat in 2 hours if needed  2 tablet  0    Past Medical History  Diagnosis Date  . Epilepsy     medically cleared and not followed by a physician for 16 yrs  . Fifth disease infant  . Chicken pox 7 yrs old  . Anxiety   . Hyperlipidemia 04/24/2011  . Hx of tympanostomy tubes   . Overweight 05/21/2011  . Newly recognized heart murmur 09/12/2011  . Functional dyspepsia 09/12/2011    Past Surgical History  Procedure Date  . Right ankle surgery 2011    removal of tissue and reconstruction    Family History  Problem Relation Age of Onset  . Hypertension Mother   . Hyperlipidemia Father   . Dementia Maternal Grandmother   . Diabetes Paternal Grandmother   . Depression Paternal Grandfather     suicide    History   Social History  . Marital Status: Married    Spouse Name: N/A    Number of Children: N/A  . Years of Education: N/A   Occupational History  . Not on file.   Social History Main Topics  .  Smoking status: Never Smoker   . Smokeless tobacco: Never Used  . Alcohol Use: Yes     occasionally  . Drug Use: No  . Sexually Active: Yes -- Female partner(s)   Other Topics Concern  . Not on file   Social History Narrative  . No narrative on file    Review of Systems:  All systems reviewed.  They are negative to the above problem except as previously stated.  Vital Signs: BP 123/89  Pulse 67  Ht 5\' 2"  (1.575 m)  Wt 198 lb (89.812 kg)  BMI 36.21 kg/m2  LMP 08/28/2011  Physical Exam Patient is in NAD HEENT:  Normocephalic, atraumatic. EOMI, PERRLA.  Neck: JVP is normal. No thyromegaly. No bruits.  Lungs: clear to auscultation. No rales no wheezes.  Heart: Regular rate and rhythm. Normal S1, S2. No S3.   No significant murmurs. PMI not displaced.  Abdomen:  Supple, nontender. Normal bowel sounds. No masses. No hepatomegaly.  Extremities:   Good distal pulses throughout. No lower extremity edema.  Musculoskeletal :moving all  extremities.  Neuro:   alert and oriented x3.  CN II-XII grossly intact.  EKG:  SR.  67 bpm     Assessment and Plan:

## 2011-09-28 NOTE — Patient Instructions (Signed)
Your physician has recommended that you wear an event monitor. Event monitors are medical devices that record the heart's electrical activity. Doctors most often [place] these monitors to diagnose arrhythmias. Arrhythmias are problems with the speed or rhythm of the heartbeat. The monitor is a small, portable device. You can wear one while you do your normal daily activities. This is usually used to diagnose what is causing palpitations/syncope (passing out).  Fasting LAB WORK at [facility].

## 2011-09-29 NOTE — Assessment & Plan Note (Signed)
Severe elevation of LDL   On talking to her there are modifications she can make in diet choices.  She is signif overweight.  I would recomm doing this for now.  Gave her names of resources.

## 2011-09-29 NOTE — Assessment & Plan Note (Signed)
I am not sure what these spells represent. Concerning is spell at work I would recomm the patient be set up for an event monitor.  No other changes in regimen.

## 2011-10-03 ENCOUNTER — Encounter (INDEPENDENT_AMBULATORY_CARE_PROVIDER_SITE_OTHER): Payer: BC Managed Care – PPO

## 2011-10-03 ENCOUNTER — Other Ambulatory Visit: Payer: Self-pay | Admitting: Internal Medicine

## 2011-10-03 DIAGNOSIS — R002 Palpitations: Secondary | ICD-10-CM

## 2011-10-03 LAB — COMPREHENSIVE METABOLIC PANEL
AST: 22 U/L (ref 0–37)
Albumin: 4.4 g/dL (ref 3.5–5.2)
Alkaline Phosphatase: 48 U/L (ref 39–117)
BUN: 18 mg/dL (ref 6–23)
Calcium: 8.8 mg/dL (ref 8.4–10.5)
Chloride: 105 mEq/L (ref 96–112)
Glucose, Bld: 82 mg/dL (ref 70–99)
Potassium: 4.1 mEq/L (ref 3.5–5.3)
Sodium: 140 mEq/L (ref 135–145)
Total Protein: 6.2 g/dL (ref 6.0–8.3)

## 2011-10-03 LAB — CBC
HCT: 41.1 % (ref 39.0–52.0)
Hemoglobin: 13.2 g/dL (ref 13.0–17.0)
MCHC: 32.1 g/dL (ref 30.0–36.0)
RDW: 12.5 % (ref 11.5–15.5)
WBC: 9.6 10*3/uL (ref 4.0–10.5)

## 2011-10-03 LAB — LIPID PANEL
HDL: 53 mg/dL (ref >39)
LDL Cholesterol: 183 mg/dL — ABNORMAL HIGH (ref 0–99)

## 2011-10-09 ENCOUNTER — Telehealth: Payer: Self-pay

## 2011-10-09 ENCOUNTER — Telehealth: Payer: Self-pay | Admitting: Internal Medicine

## 2011-10-09 MED ORDER — AZITHROMYCIN 250 MG PO TABS: ORAL_TABLET | ORAL | Status: AC

## 2011-10-09 NOTE — Telephone Encounter (Signed)
RX sent through the computer and pt informed

## 2011-10-09 NOTE — Telephone Encounter (Signed)
Left a message to call back.

## 2011-10-09 NOTE — Telephone Encounter (Signed)
Can have one Zpak if no improvement needs to come in. Azithromycin 250 mg tabs 2 tabs po once and then 1 tab po daily x 4 days

## 2011-10-09 NOTE — Telephone Encounter (Signed)
Chart is locked up, someone in Cardiology is in it? I will call Zpak in and notify patient.

## 2011-10-09 NOTE — Telephone Encounter (Signed)
Fu call Patient returning your call for test results

## 2011-10-09 NOTE — Telephone Encounter (Signed)
Follow up on previous call:  Patient calling returning Annice Pih called from yesterday - test results.

## 2011-10-09 NOTE — Telephone Encounter (Signed)
Pt called stating that since 10-04-11 she has head congestion, sore throat, cough w/ phlegm (green), low grade fever? Pt would like a Zpak called into [facility]? Please advise if a Zpak can be called in or does patient need to be seen?

## 2011-10-09 NOTE — Telephone Encounter (Signed)
Pt  agreed to see nutritionist for teaching of diet for hyperlipidemia. Dr Tenny Craw not here today to assist with filling  out order. I will forward to [facility] RN/ see Dr Tenny Craw comment on lab work

## 2011-10-10 NOTE — Telephone Encounter (Signed)
Diet referral form completed. Will have Dr.Ross sign on 3/1 and fax to Nutrition management at [facility]  [phone].

## 2011-11-07 ENCOUNTER — Telehealth: Payer: Self-pay | Admitting: *Deleted

## 2011-11-07 NOTE — Telephone Encounter (Signed)
Left message to call back concerning results of event monitor from 2/20 thru 11/01/2011.

## 2011-11-08 ENCOUNTER — Telehealth: Payer: Self-pay | Admitting: Internal Medicine

## 2011-11-08 MED ORDER — METOPROLOL SUCCINATE ER 25 MG PO TB24: 25.0000 mg | ORAL_TABLET | Freq: Every day | ORAL | Status: DC

## 2011-11-08 NOTE — Telephone Encounter (Signed)
Spoke with patient and reviewed event monitor from 2/20 thru 3/21. No significant arrhythmias seen. One episode of sinus tachycardia while riding a horse. No rhythm problems at other times when felt pressure. Dr.Ross recommended ample fluid intake and to start ToprolXL 25mg  every day for borderline BP at clinic visit. Script sent to [facility] and ROV made with PR for 01/14/2012.

## 2011-11-08 NOTE — Telephone Encounter (Signed)
Follow- up: ° ° °Patient returned your phone call. Please call back. °

## 2011-11-08 NOTE — Telephone Encounter (Signed)
See note from 11/08/2011.

## 2012-01-14 ENCOUNTER — Ambulatory Visit: Payer: BC Managed Care – PPO | Admitting: Internal Medicine

## 2012-02-25 ENCOUNTER — Ambulatory Visit: Payer: BC Managed Care – PPO | Admitting: Internal Medicine

## 2012-03-19 ENCOUNTER — Ambulatory Visit (INDEPENDENT_AMBULATORY_CARE_PROVIDER_SITE_OTHER): Payer: BC Managed Care – PPO | Admitting: Family Medicine

## 2012-03-19 ENCOUNTER — Encounter: Payer: Self-pay | Admitting: Family Medicine

## 2012-03-19 VITALS — BP 142/95 | HR 71 | Temp 97.6°F | Ht 62.0 in | Wt 198.8 lb

## 2012-03-19 DIAGNOSIS — G44219 Episodic tension-type headache, not intractable: Secondary | ICD-10-CM

## 2012-03-19 DIAGNOSIS — R002 Palpitations: Secondary | ICD-10-CM

## 2012-03-19 DIAGNOSIS — F341 Dysthymic disorder: Secondary | ICD-10-CM

## 2012-03-19 DIAGNOSIS — F418 Other specified anxiety disorders: Secondary | ICD-10-CM

## 2012-03-19 DIAGNOSIS — F419 Anxiety disorder, unspecified: Secondary | ICD-10-CM

## 2012-03-19 DIAGNOSIS — F411 Generalized anxiety disorder: Secondary | ICD-10-CM

## 2012-03-19 DIAGNOSIS — R112 Nausea with vomiting, unspecified: Secondary | ICD-10-CM

## 2012-03-19 MED ORDER — NEBIVOLOL HCL 5 MG PO TABS: 5.0000 mg | ORAL_TABLET | Freq: Every day | ORAL | Status: DC

## 2012-03-19 MED ORDER — ONDANSETRON 8 MG PO TBDP: 8.0000 mg | ORAL_TABLET | Freq: Three times a day (TID) | ORAL | Status: DC | PRN

## 2012-03-19 MED ORDER — PROMETHAZINE HCL 25 MG PO TABS: 25.0000 mg | ORAL_TABLET | Freq: Three times a day (TID) | ORAL | Status: DC | PRN

## 2012-03-19 MED ORDER — CITALOPRAM HYDROBROMIDE 10 MG PO TABS: 10.0000 mg | ORAL_TABLET | Freq: Every day | ORAL | Status: DC

## 2012-03-19 MED ORDER — ALPRAZOLAM 0.25 MG PO TABS: 0.2500 mg | ORAL_TABLET | Freq: Three times a day (TID) | ORAL | Status: DC | PRN

## 2012-03-19 NOTE — Assessment & Plan Note (Addendum)
Is having a great deal more palpitations since her stress level increased dramatically. Metoprolol caused too much fatigue and sedation. We've given her samples of the Bystolic 5 mg daily and we will reassess at next visit

## 2012-03-19 NOTE — Progress Notes (Signed)
Patient ID: Ashley Chang, female   DOB: 10/18/1975, 36 y.o.   MRN: [id number] Ashley Chang [id number] 11-10-1975 03/19/2012      Progress Note-Follow Up  Subjective  Chief Complaint  Chief Complaint  Patient presents with  . Panic Attack    anxiety- something to get her through the next couple of days    HPI  Patient is a 36 year old Caucasian female who is in today with acute worsening of anxiety. She sat up to date an 34 year old show coarse when she rides in time and has a neurologic disorder and has to be euthanized this week. She is crying and very labile in the visit. She denies to the point of vomiting all day. She's been unable to keep anything down. She has a bad tension headache, myalgias and is unable to concentrate. No suicidal ideation but she is aware that she needs to start treating disorder will get away from her. Review citalopram in the past with good effect and she is willing to restart that. She's not metoprolol although it was causing excessive fatigue but she does note since all of this stress worsened she is having significantly more palpitations. She is unable to eat or drink anything today. No chest pain or shortness of breath. No fevers, chills, GU symptoms noted  Past Medical History  Diagnosis Date  . Epilepsy     medically cleared and not followed by a physician for 16 yrs  . Fifth disease infant  . Chicken pox 7 yrs old  . Anxiety   . Hyperlipidemia 04/24/2011  . Hx of tympanostomy tubes   . Overweight 05/21/2011  . Newly recognized heart murmur 09/12/2011  . Functional dyspepsia 09/12/2011  . Anxiety 04/24/2011    Past Surgical History  Procedure Date  . Right ankle surgery 2011    removal of tissue and reconstruction    Family History  Problem Relation Age of Onset  . Hypertension Mother   . Hyperlipidemia Father   . Dementia Maternal Grandmother   . Diabetes Paternal Grandmother   . Depression Paternal Grandfather     suicide     History   Social History  . Marital Status: Married    Spouse Name: N/A    Number of Children: N/A  . Years of Education: N/A   Occupational History  . Not on file.   Social History Main Topics  . Smoking status: Never Smoker   . Smokeless tobacco: Never Used  . Alcohol Use: Yes     occasionally  . Drug Use: No  . Sexually Active: Yes -- Female partner(s)   Other Topics Concern  . Not on file   Social History Narrative  . No narrative on file    Current Outpatient Prescriptions on File Prior to Visit  Medication Sig Dispense Refill  . albuterol (PROAIR HFA) 108 (90 BASE) MCG/ACT inhaler Inhale 2 puffs into the lungs every 6 (six) hours as needed for wheezing.  1 Inhaler  5  . Artificial Tear Ointment (ARTIFICIAL TEARS) ointment Place into both eyes as needed.        . citalopram (CELEXA) 10 MG tablet Take 1 tablet (10 mg total) by mouth daily.  30 tablet  2  . cyclobenzaprine (FLEXERIL) 5 MG tablet Take 5 mg by mouth as needed.        . ethynodiol-ethinyl estradiol (ZOVIA 1/35E, 28,) 1-35 MG-MCG tablet Take 1 tablet by mouth daily.        Marland Kitchen FIBER, GUAR GUM,  PO Take 5 tablets by mouth daily.      . Flaxseed, Linseed, (FLAX SEEDS PO) Take 1 tablet by mouth daily.      Marland Kitchen HYDROcodone-acetaminophen (NORCO) 5-325 MG per tablet Take 1 tablet by mouth as needed.        Marland Kitchen ibuprofen (ADVIL,MOTRIN) 200 MG tablet Take 400 mg by mouth as needed.        . metoprolol succinate (TOPROL XL) 25 MG 24 hr tablet Take 1 tablet (25 mg total) by mouth daily.  30 tablet  6  . Multiple Vitamin (MULTIVITAMIN) tablet Take 1 tablet by mouth daily.        . nebivolol (BYSTOLIC) 5 MG tablet Take 1 tablet (5 mg total) by mouth daily.  30 tablet  0  . promethazine (PHENERGAN) 25 MG tablet Take 1 tablet (25 mg total) by mouth every 8 (eight) hours as needed for nausea.  40 tablet  1  . rizatriptan (MAXALT-MLT) 10 MG disintegrating tablet Take 1 tablet (10 mg total) by mouth as needed for migraine. May  repeat in 2 hours if needed  2 tablet  0    No Known Allergies  Review of Systems  Review of Systems  Constitutional: Positive for malaise/fatigue. Negative for fever.  HENT: Negative for congestion.   Eyes: Negative for discharge.  Respiratory: Negative for shortness of breath.   Cardiovascular: Positive for palpitations. Negative for chest pain and leg swelling.  Gastrointestinal: Negative for nausea, abdominal pain and diarrhea.  Genitourinary: Negative for dysuria.  Musculoskeletal: Positive for myalgias. Negative for falls.  Skin: Negative for rash.  Neurological: Positive for headaches. Negative for loss of consciousness.  Endo/Heme/Allergies: Negative for polydipsia.  Psychiatric/Behavioral: Positive for depression. Negative for suicidal ideas. The patient is nervous/anxious and has insomnia.     Objective  BP 142/95  Pulse 71  Temp 97.6 F (36.4 C) (Temporal)  Ht 5\' 2"  (1.575 m)  Wt 198 lb 12.8 oz (90.175 kg)  BMI 36.36 kg/m2  SpO2 97%  LMP 03/11/2012  Physical Exam  Physical Exam  Constitutional: She is oriented to person, place, and time and well-developed, well-nourished, and in no distress. No distress.  HENT:  Head: Normocephalic and atraumatic.  Eyes: Conjunctivae are normal.  Neck: Neck supple. No thyromegaly present.  Cardiovascular: Normal rate, regular rhythm and normal heart sounds.   No murmur heard. Pulmonary/Chest: Effort normal and breath sounds normal. She has no wheezes.  Abdominal: She exhibits no distension and no mass.  Musculoskeletal: She exhibits no edema.  Lymphadenopathy:    She has no cervical adenopathy.  Neurological: She is alert and oriented to person, place, and time.  Skin: Skin is warm and dry. No rash noted. She is not diaphoretic.  Psychiatric: Memory and judgment normal.       crying    Lab Results  Component Value Date   TSH 1.953 04/19/2011   Lab Results  Component Value Date   WBC 9.6 10/03/2011   HGB 13.2  10/03/2011   HCT 41.1 10/03/2011   MCV 97.2 10/03/2011   PLT 249 10/03/2011   Lab Results  Component Value Date   CREATININE 0.80 10/03/2011   BUN 18 10/03/2011   NA 140 10/03/2011   K 4.1 10/03/2011   CL 105 10/03/2011   CO2 22 10/03/2011   Lab Results  Component Value Date   ALT 21 10/03/2011   AST 22 10/03/2011   ALKPHOS 48 10/03/2011   BILITOT 0.7 10/03/2011   Lab Results  Component Value Date   CHOL 256* 10/03/2011   Lab Results  Component Value Date   HDL 53 10/03/2011   Lab Results  Component Value Date   LDLCALC 183* 10/03/2011   Lab Results  Component Value Date   TRIG 99 10/03/2011   Lab Results  Component Value Date   CHOLHDL 4.8 10/03/2011     Assessment & Plan  PALPITATIONS Is having a great deal more palpitations since her stress level increased dramatically. Metoprolol caused too much fatigue and sedation. We've given her samples of the Bystolic 5 mg daily and we will reassess at next visit  Depression with anxiety Patient under a great deal of stress. She has 34 year old she'll worse that she rides and has gone numerous copious with. She is to set up today that he has a neurologic condition and needs to be put down. She is crying and very fragile today she has done well on Celexa in the past we'll restart that at 10 mg daily. She has obviously already and is using 1-2 tabs daily as needed and she is allowed to continue to do that. She may take one during the day and if she has trouble sleeping take up to 2 at night. We will see her in follow up 3-4 weeks or as needed. For nausea and anorexia are extreme today she is given some prescriptions for Phenergan and Zofran to alternate and use depending on whether she needs to sleep or stay awake. He is also encouraged to try ginger and bland diet.  EPISODIC TENSION TYPE HEADACHE Bad headache with increased stress today. She is having some nausea as well. So we will have her go to the Phenergan hydrate well and trying to eat  a bland snack. About a half-hour after she takes the Phenergan she is to take 2 Advil and try and break the headache if she is unsuccessful she will let us know.

## 2012-03-19 NOTE — Assessment & Plan Note (Signed)
Patient under a great deal of stress. She has 34 year old she'll worse that she rides and has gone numerous copious with. She is to set up today that he has a neurologic condition and needs to be put down. She is crying and very fragile today she has done well on Celexa in the past we'll restart that at 10 mg daily. She has obviously already and is using 1-2 tabs daily as needed and she is allowed to continue to do that. She may take one during the day and if she has trouble sleeping take up to 2 at night. We will see her in follow up 3-4 weeks or as needed. For nausea and anorexia are extreme today she is given some prescriptions for Phenergan and Zofran to alternate and use depending on whether she needs to sleep or stay awake. He is also encouraged to try ginger and bland diet.

## 2012-03-19 NOTE — Patient Instructions (Addendum)

## 2012-03-19 NOTE — Assessment & Plan Note (Signed)
Bad headache with increased stress today. She is having some nausea as well. So we will have her go to the Phenergan hydrate well and trying to eat a bland snack. About a half-hour after she takes the Phenergan she is to take 2 Advil and try and break the headache if she is unsuccessful she will let us know.

## 2012-04-18 ENCOUNTER — Encounter: Payer: Self-pay | Admitting: Family Medicine

## 2012-04-18 ENCOUNTER — Ambulatory Visit (INDEPENDENT_AMBULATORY_CARE_PROVIDER_SITE_OTHER): Payer: BC Managed Care – PPO | Admitting: Family Medicine

## 2012-04-18 VITALS — BP 129/85 | HR 70 | Temp 97.9°F | Ht 62.0 in | Wt 201.8 lb

## 2012-04-18 DIAGNOSIS — E663 Overweight: Secondary | ICD-10-CM

## 2012-04-18 DIAGNOSIS — F419 Anxiety disorder, unspecified: Secondary | ICD-10-CM

## 2012-04-18 DIAGNOSIS — R002 Palpitations: Secondary | ICD-10-CM

## 2012-04-18 DIAGNOSIS — G44219 Episodic tension-type headache, not intractable: Secondary | ICD-10-CM

## 2012-04-18 DIAGNOSIS — R112 Nausea with vomiting, unspecified: Secondary | ICD-10-CM

## 2012-04-18 DIAGNOSIS — G4700 Insomnia, unspecified: Secondary | ICD-10-CM

## 2012-04-18 DIAGNOSIS — R011 Cardiac murmur, unspecified: Secondary | ICD-10-CM

## 2012-04-18 DIAGNOSIS — F411 Generalized anxiety disorder: Secondary | ICD-10-CM

## 2012-04-18 DIAGNOSIS — K219 Gastro-esophageal reflux disease without esophagitis: Secondary | ICD-10-CM

## 2012-04-18 DIAGNOSIS — F418 Other specified anxiety disorders: Secondary | ICD-10-CM

## 2012-04-18 DIAGNOSIS — IMO0001 Reserved for inherently not codable concepts without codable children: Secondary | ICD-10-CM

## 2012-04-18 DIAGNOSIS — M62838 Other muscle spasm: Secondary | ICD-10-CM

## 2012-04-18 DIAGNOSIS — F341 Dysthymic disorder: Secondary | ICD-10-CM

## 2012-04-18 HISTORY — DX: Insomnia, unspecified: G47.00

## 2012-04-18 HISTORY — DX: Reserved for inherently not codable concepts without codable children: IMO0001

## 2012-04-18 MED ORDER — ONDANSETRON 8 MG PO TBDP: 8.0000 mg | ORAL_TABLET | Freq: Three times a day (TID) | ORAL | Status: AC | PRN

## 2012-04-18 MED ORDER — METHOCARBAMOL 500 MG PO TABS: 500.0000 mg | ORAL_TABLET | Freq: Three times a day (TID) | ORAL | Status: AC | PRN

## 2012-04-18 MED ORDER — RANITIDINE HCL 300 MG PO TABS: 300.0000 mg | ORAL_TABLET | Freq: Every day | ORAL | Status: DC

## 2012-04-18 MED ORDER — ALPRAZOLAM 0.5 MG PO TABS: 0.5000 mg | ORAL_TABLET | Freq: Three times a day (TID) | ORAL | Status: AC | PRN

## 2012-04-18 MED ORDER — CITALOPRAM HYDROBROMIDE 10 MG PO TABS: 10.0000 mg | ORAL_TABLET | Freq: Every day | ORAL | Status: DC

## 2012-04-18 NOTE — Assessment & Plan Note (Signed)
She is accompanied by her mother today. She feels the citalopram is helping with her grieving process. They had to put her show worse down on August 12. She is better and worse days. We will continue citalopram at 10. She uses an Alprazolam during the day as needed with good results. It does not cause excessive sedation when she's stressed. At night she is using 2 to help her sleep. She is given a refill today but we changed his strength from 0.25-0.5 and she will take a half to 1 tab as needed up to 3 times a day. She is encouraged to try nights without the alprazolam once alternate with Benadryl which she has used in the past with good effect to avoid any excessive habituation. She expresses understanding.

## 2012-04-18 NOTE — Progress Notes (Signed)
Patient ID: Ashley Chang, female   DOB: [birth year], 36 y.o.   MRN: [id number] Ashley Chang [id number] 1975-10-03 04/18/2012      Progress Note-Follow Up  Subjective  Chief Complaint  Chief Complaint  Patient presents with  . Follow-up    1 month    HPI  Patient is a 36 year old female in today for follow up. Last month she came in very tearful on August 7 because she found that her show worse was terminally ill. They had to put him down on August 12. She reports the medications have helped her to get to the month and her mother is with her today and agrees. She's tolerating the citalopram 10 mg and does believe that that is helping her stay somewhat calmer. She uses the alprazolam intermittently. She find she'll need to have a dose occasionally during the day but does take 2 at night to help her rest. When she tries not to take it she has a hard time settling down. She denies any suicidal ideation but is still struggling with frequent crying spells, poor concentration and hypersomnolence. She's also had increased reflux and dyspepsia. Denies any trouble to her bowels such as diarrhea or constipation. No chest pain, palpitations, shortness of breath noted. She has no other new complaints has had no recent flare in her asthma or other concerns. She did have some muscle spasm with a bad headache earlier in the week but it was the anniversary of her reported that was of course and she thinks that was a large part of it. She notes cyclobenzaprine makes her excessively sleepy as does Maxalt. For the headache she tried some massage and pain meds and did get some relief. She is pleased with the Bystolic and does feel that has helped to light her palpitations but does not contribute to fatigue.  Past Medical History  Diagnosis Date  . Epilepsy     medically cleared and not followed by a physician for 16 yrs  . Fifth disease infant  . Chicken pox 7 yrs old  . Anxiety   . Hyperlipidemia  04/24/2011  . Hx of tympanostomy tubes   . Overweight 05/21/2011  . Newly recognized heart murmur 09/12/2011  . Functional dyspepsia 09/12/2011  . Anxiety 04/24/2011  . Reflux 04/18/2012  . Insomnia 04/18/2012    Past Surgical History  Procedure Date  . Right ankle surgery 2011    removal of tissue and reconstruction    Family History  Problem Relation Age of Onset  . Hypertension Mother   . Hyperlipidemia Father   . Dementia Maternal Grandmother   . Diabetes Paternal Grandmother   . Depression Paternal Grandfather     suicide    History   Social History  . Marital Status: Married    Spouse Name: N/A    Number of Children: N/A  . Years of Education: N/A   Occupational History  . Not on file.   Social History Main Topics  . Smoking status: Never Smoker   . Smokeless tobacco: Never Used  . Alcohol Use: Yes     occasionally  . Drug Use: No  . Sexually Active: Yes -- Female partner(s)   Other Topics Concern  . Not on file   Social History Narrative  . No narrative on file    Current Outpatient Prescriptions on File Prior to Visit  Medication Sig Dispense Refill  . Artificial Tear Ointment (ARTIFICIAL TEARS) ointment Place into both eyes as needed.        Marland Kitchen  ethynodiol-ethinyl estradiol (ZOVIA 1/35E, 28,) 1-35 MG-MCG tablet Take 1 tablet by mouth daily.        Marland Kitchen FIBER, GUAR GUM, PO Take 5 tablets by mouth daily.      . Flaxseed, Linseed, (FLAX SEEDS PO) Take 1 tablet by mouth daily.      Marland Kitchen HYDROcodone-acetaminophen (NORCO) 5-325 MG per tablet Take 1 tablet by mouth as needed.        Marland Kitchen ibuprofen (ADVIL,MOTRIN) 200 MG tablet Take 400 mg by mouth as needed.        . Multiple Vitamin (MULTIVITAMIN) tablet Take 1 tablet by mouth daily.        . nebivolol (BYSTOLIC) 5 MG tablet Take 1 tablet (5 mg total) by mouth daily.  30 tablet  0  . DISCONTD: citalopram (CELEXA) 10 MG tablet Take 1 tablet (10 mg total) by mouth daily.  30 tablet  2  . albuterol (PROAIR HFA) 108 (90 BASE)  MCG/ACT inhaler Inhale 2 puffs into the lungs every 6 (six) hours as needed for wheezing.  1 Inhaler  5  . ranitidine (ZANTAC) 300 MG tablet Take 1 tablet (300 mg total) by mouth at bedtime.  30 tablet  2    Allergies  Allergen Reactions  . Maxalt Mlt (Rizatriptan)     hypersomnolence    Review of Systems  Review of Systems  Constitutional: Positive for malaise/fatigue. Negative for fever.  HENT: Positive for neck pain. Negative for congestion.   Eyes: Negative for discharge.  Respiratory: Negative for shortness of breath.   Cardiovascular: Negative for chest pain, palpitations and leg swelling.  Gastrointestinal: Positive for heartburn, nausea and vomiting. Negative for abdominal pain and diarrhea.  Genitourinary: Negative for dysuria.  Musculoskeletal: Negative for falls.  Skin: Negative for rash.  Neurological: Positive for headaches. Negative for loss of consciousness.  Endo/Heme/Allergies: Negative for polydipsia.  Psychiatric/Behavioral: Positive for depression. Negative for suicidal ideas. The patient is nervous/anxious and has insomnia.     Objective  BP 129/85  Pulse 70  Temp 97.9 F (36.6 C) (Temporal)  Ht 5\' 2"  (1.575 m)  Wt 201 lb 12.8 oz (91.536 kg)  BMI 36.91 kg/m2  SpO2 97%  LMP 04/08/2012  Physical Exam  Physical Exam  Constitutional: She is oriented to person, place, and time and well-developed, well-nourished, and in no distress. No distress.  HENT:  Head: Normocephalic and atraumatic.  Eyes: Conjunctivae are normal.  Neck: Neck supple. No thyromegaly present.  Cardiovascular: Normal rate and regular rhythm.   Murmur heard.      2/6 murmur, sys  Pulmonary/Chest: Effort normal and breath sounds normal. She has no wheezes.  Abdominal: She exhibits no distension and no mass.  Musculoskeletal: She exhibits no edema.  Lymphadenopathy:    She has no cervical adenopathy.  Neurological: She is alert and oriented to person, place, and time.  Skin: Skin  is warm and dry. No rash noted. She is not diaphoretic.  Psychiatric: Memory, affect and judgment normal.    Lab Results  Component Value Date   TSH 1.953 04/19/2011   Lab Results  Component Value Date   WBC 9.6 10/03/2011   HGB 13.2 10/03/2011   HCT 41.1 10/03/2011   MCV 97.2 10/03/2011   PLT 249 10/03/2011   Lab Results  Component Value Date   CREATININE 0.80 10/03/2011   BUN 18 10/03/2011   NA 140 10/03/2011   K 4.1 10/03/2011   CL 105 10/03/2011   CO2 22 10/03/2011   Lab Results  Component Value Date   ALT 21 10/03/2011   AST 22 10/03/2011   ALKPHOS 48 10/03/2011   BILITOT 0.7 10/03/2011   Lab Results  Component Value Date   CHOL 256* 10/03/2011   Lab Results  Component Value Date   HDL 53 10/03/2011   Lab Results  Component Value Date   LDLCALC 183* 10/03/2011   Lab Results  Component Value Date   TRIG 99 10/03/2011   Lab Results  Component Value Date   CHOLHDL 4.8 10/03/2011     Assessment & Plan  Newly recognized heart murmur asymptomatic  PALPITATIONS No c/o today.  Depression with anxiety She is accompanied by her mother today. She feels the citalopram is helping with her grieving process. They had to put her show worse down on August 12. She is better and worse days. We will continue citalopram at 10. She uses an Alprazolam during the day as needed with good results. It does not cause excessive sedation when she's stressed. At night she is using 2 to help her sleep. She is given a refill today but we changed his strength from 0.25-0.5 and she will take a half to 1 tab as needed up to 3 times a day. She is encouraged to try nights without the alprazolam once alternate with Benadryl which she has used in the past with good effect to avoid any excessive habituation. She expresses understanding.  EPISODIC TENSION TYPE HEADACHE Had a bad HA recently   Overweight Slight weight gain since last visit, encouraged increased activity and continue to  monitor  Reflux New onset, likely precipitated by her stress, will add Ranitidine 300 mg po qhs and avoid offending foods.  Insomnia Alprazolam is working well at 0.5 mg but we discussed concerns about habituation over time and she agrees to try alternating the use with nights of no use or Benadryl instead.

## 2012-04-18 NOTE — Assessment & Plan Note (Signed)
No c/o today 

## 2012-04-18 NOTE — Assessment & Plan Note (Signed)
asymptomatic

## 2012-04-18 NOTE — Assessment & Plan Note (Signed)
Slight weight gain since last visit, encouraged increased activity and continue to monitor

## 2012-04-18 NOTE — Assessment & Plan Note (Signed)
New onset, likely precipitated by her stress, will add Ranitidine 300 mg po qhs and avoid offending foods.

## 2012-04-18 NOTE — Assessment & Plan Note (Signed)
Had a bad HA recently

## 2012-04-18 NOTE — Assessment & Plan Note (Signed)
Alprazolam is working well at 0.5 mg but we discussed concerns about habituation over time and she agrees to try alternating the use with nights of no use or Benadryl instead.

## 2012-05-02 ENCOUNTER — Telehealth: Payer: Self-pay | Admitting: Family Medicine

## 2012-05-02 NOTE — Telephone Encounter (Signed)
Caller: [name]/Patient; Patient Name: Ashley Chang; PCP: [facility]); Best Callback Phone Number: [phone];  Calling regarding nasal congestion, low grade fever of 99-100, fatigue, onset of symptoms 04/28/12. Taking Advil sinus and Tylenol, current temperature 99.8, LMP 04/09/12. Emergent signs and symptoms ruled out as per Upper Respiratory Infection protocol and home care advice given with call back parameters.

## 2012-05-02 NOTE — Telephone Encounter (Signed)
Noted  

## 2012-07-23 ENCOUNTER — Ambulatory Visit (INDEPENDENT_AMBULATORY_CARE_PROVIDER_SITE_OTHER): Payer: BC Managed Care – PPO | Admitting: Family Medicine

## 2012-07-23 ENCOUNTER — Encounter: Payer: Self-pay | Admitting: Family Medicine

## 2012-07-23 ENCOUNTER — Telehealth: Payer: Self-pay | Admitting: Family Medicine

## 2012-07-23 VITALS — BP 125/80 | HR 69 | Temp 98.4°F | Ht 62.0 in | Wt 201.1 lb

## 2012-07-23 DIAGNOSIS — J40 Bronchitis, not specified as acute or chronic: Secondary | ICD-10-CM

## 2012-07-23 MED ORDER — HYDROCOD POLST-CHLORPHEN POLST 10-8 MG/5ML PO LQCR: 5.0000 mL | Freq: Every evening | ORAL | Status: DC | PRN

## 2012-07-23 MED ORDER — AZITHROMYCIN 250 MG PO TABS: ORAL_TABLET | ORAL | Status: DC

## 2012-07-23 MED ORDER — BENZONATATE 200 MG PO CAPS: 200.0000 mg | ORAL_CAPSULE | Freq: Three times a day (TID) | ORAL | Status: DC | PRN

## 2012-07-23 NOTE — Telephone Encounter (Signed)
Patient is requesting a note excusing her from work [employer]. The note does need to state that she has pertussis. She can pick up the note tomorrow 07/24/12. Please contact patient when it is ready to pickup.

## 2012-07-23 NOTE — Patient Instructions (Addendum)

## 2012-07-23 NOTE — Telephone Encounter (Signed)
Please advise 

## 2012-07-23 NOTE — Telephone Encounter (Signed)
OK to print her a note for off work today, seen in office with acute illness on 07/23/12

## 2012-07-24 ENCOUNTER — Encounter: Payer: Self-pay | Admitting: Family Medicine

## 2012-07-24 DIAGNOSIS — J40 Bronchitis, not specified as acute or chronic: Secondary | ICD-10-CM

## 2012-07-24 HISTORY — DX: Bronchitis, not specified as acute or chronic: J40

## 2012-07-24 NOTE — Telephone Encounter (Signed)
Note printed and left a detailed message on patients cell phone

## 2012-07-27 NOTE — Progress Notes (Signed)
Patient ID: Ashley Chang, female   DOB: Jan 02, 1976, 36 y.o.   MRN: [id number] Ashley Chang [id number] 06/01/1976 07/27/2012      Progress Note-Follow Up  Subjective  Chief Complaint  Chief Complaint  Patient presents with  . URI    HPI  Patient is a 36 year old Caucasian female who is in today complaining of worsening respiratory. She has head and chest congestion. Possible low-grade fevers but nothing high-grade. Malaise myalgias are present. Cough is keeping her up at night at times. No GI or GU complaints. No chest pain or palpitations. No ear pain and only some minor throat irritation are noted  Past Medical History  Diagnosis Date  . Epilepsy     medically cleared and not followed by a physician for 16 yrs  . Fifth disease infant  . Chicken pox 7 yrs old  . Anxiety   . Hyperlipidemia 04/24/2011  . Hx of tympanostomy tubes   . Overweight 05/21/2011  . Newly recognized heart murmur 09/12/2011  . Functional dyspepsia 09/12/2011  . Anxiety 04/24/2011  . Reflux 04/18/2012  . Insomnia 04/18/2012  . Bronchitis 07/24/2012    Past Surgical History  Procedure Date  . Right ankle surgery 2011    removal of tissue and reconstruction    Family History  Problem Relation Age of Onset  . Hypertension Mother   . Hyperlipidemia Father   . Dementia Maternal Grandmother   . Diabetes Paternal Grandmother   . Depression Paternal Grandfather     suicide    History   Social History  . Marital Status: Married    Spouse Name: N/A    Number of Children: N/A  . Years of Education: N/A   Occupational History  . Not on file.   Social History Main Topics  . Smoking status: Never Smoker   . Smokeless tobacco: Never Used  . Alcohol Use: Yes     Comment: occasionally  . Drug Use: No  . Sexually Active: Yes -- Female partner(s)   Other Topics Concern  . Not on file   Social History Narrative  . No narrative on file    Current Outpatient Prescriptions on File Prior to  Visit  Medication Sig Dispense Refill  . albuterol (PROAIR HFA) 108 (90 BASE) MCG/ACT inhaler Inhale 2 puffs into the lungs every 6 (six) hours as needed for wheezing.  1 Inhaler  5  . Artificial Tear Ointment (ARTIFICIAL TEARS) ointment Place into both eyes as needed.        . citalopram (CELEXA) 10 MG tablet Take 1 tablet (10 mg total) by mouth daily.  30 tablet  2  . ethynodiol-ethinyl estradiol (ZOVIA 1/35E, 28,) 1-35 MG-MCG tablet Take 1 tablet by mouth daily.        Marland Kitchen HYDROcodone-acetaminophen (NORCO) 5-325 MG per tablet Take 1 tablet by mouth as needed.        Marland Kitchen ibuprofen (ADVIL,MOTRIN) 200 MG tablet Take 400 mg by mouth as needed.        . Multiple Vitamin (MULTIVITAMIN) tablet Take 1 tablet by mouth daily.        . nebivolol (BYSTOLIC) 5 MG tablet Take 1 tablet (5 mg total) by mouth daily.  30 tablet  0  . ranitidine (ZANTAC) 300 MG tablet Take 1 tablet (300 mg total) by mouth at bedtime.  30 tablet  2    Allergies  Allergen Reactions  . Maxalt Mlt (Rizatriptan)     hypersomnolence    Review of Systems  Review of Systems  Constitutional: Positive for malaise/fatigue. Negative for fever.  HENT: Positive for congestion.   Eyes: Negative for discharge.  Respiratory: Positive for cough, sputum production and shortness of breath.   Cardiovascular: Negative for chest pain, palpitations and leg swelling.  Gastrointestinal: Negative for nausea, abdominal pain and diarrhea.  Genitourinary: Negative for dysuria.  Musculoskeletal: Positive for myalgias. Negative for falls.  Skin: Negative for rash.  Neurological: Negative for loss of consciousness and headaches.  Endo/Heme/Allergies: Negative for polydipsia.  Psychiatric/Behavioral: Negative for depression and suicidal ideas. The patient is not nervous/anxious and does not have insomnia.     Objective  BP 125/80  Pulse 69  Temp 98.4 F (36.9 C) (Temporal)  Ht 5\' 2"  (1.575 m)  Wt 201 lb 1.9 oz (91.227 kg)  BMI 36.79 kg/m2   SpO2 96%  LMP 06/30/2012  Physical Exam  Physical Exam  Constitutional: She is oriented to person, place, and time and well-developed, well-nourished, and in no distress. No distress.  HENT:  Head: Normocephalic and atraumatic.  Eyes: Conjunctivae normal are normal.  Neck: Neck supple. No thyromegaly present.  Cardiovascular: Normal rate, regular rhythm and normal heart sounds.   No murmur heard. Pulmonary/Chest: Effort normal. She has no wheezes. She has rales.  Abdominal: She exhibits no distension and no mass.  Musculoskeletal: She exhibits no edema.  Lymphadenopathy:    She has no cervical adenopathy.  Neurological: She is alert and oriented to person, place, and time.  Skin: Skin is warm and dry. No rash noted. She is not diaphoretic.  Psychiatric: Memory, affect and judgment normal.    Lab Results  Component Value Date   TSH 1.953 04/19/2011   Lab Results  Component Value Date   WBC 9.6 10/03/2011   HGB 13.2 10/03/2011   HCT 41.1 10/03/2011   MCV 97.2 10/03/2011   PLT 249 10/03/2011   Lab Results  Component Value Date   CREATININE 0.80 10/03/2011   BUN 18 10/03/2011   NA 140 10/03/2011   K 4.1 10/03/2011   CL 105 10/03/2011   CO2 22 10/03/2011   Lab Results  Component Value Date   ALT 21 10/03/2011   AST 22 10/03/2011   ALKPHOS 48 10/03/2011   BILITOT 0.7 10/03/2011   Lab Results  Component Value Date   CHOL 256* 10/03/2011   Lab Results  Component Value Date   HDL 53 10/03/2011   Lab Results  Component Value Date   LDLCALC 183* 10/03/2011   Lab Results  Component Value Date   TRIG 99 10/03/2011   Lab Results  Component Value Date   CHOLHDL 4.8 10/03/2011     Assessment & Plan  Bronchitis Azithromycin, Tussionex,  Tessaon Perles, increase rest.

## 2012-07-27 NOTE — Assessment & Plan Note (Signed)
Azithromycin, Tussionex,  Tessaon Perles, increase rest.

## 2012-08-24 ENCOUNTER — Other Ambulatory Visit: Payer: Self-pay | Admitting: Family Medicine

## 2012-09-05 ENCOUNTER — Ambulatory Visit (INDEPENDENT_AMBULATORY_CARE_PROVIDER_SITE_OTHER): Payer: BC Managed Care – PPO | Admitting: Family Medicine

## 2012-09-05 ENCOUNTER — Encounter: Payer: Self-pay | Admitting: Family Medicine

## 2012-09-05 VITALS — BP 115/77 | HR 77 | Temp 98.8°F | Ht 61.0 in | Wt 208.0 lb

## 2012-09-05 DIAGNOSIS — J111 Influenza due to unidentified influenza virus with other respiratory manifestations: Secondary | ICD-10-CM

## 2012-09-05 DIAGNOSIS — J45909 Unspecified asthma, uncomplicated: Secondary | ICD-10-CM

## 2012-09-05 MED ORDER — OSELTAMIVIR PHOSPHATE 75 MG PO CAPS: 75.0000 mg | ORAL_CAPSULE | Freq: Two times a day (BID) | ORAL | Status: DC

## 2012-09-05 MED ORDER — HYDROCODONE-HOMATROPINE 5-1.5 MG/5ML PO SYRP: ORAL_SOLUTION | ORAL | Status: DC

## 2012-09-05 MED ORDER — PREDNISONE 20 MG PO TABS: ORAL_TABLET | ORAL | Status: DC

## 2012-09-05 NOTE — Progress Notes (Signed)
OFFICE NOTE  09/05/2012  CC:  Chief Complaint  Patient presents with  . Cough    began today, low back pain, congestion began yesterday; fever last night; pertussis in December     HPI: Patient is a 37 y.o. Caucasian female who is here for respiratory complaints. Pt presents complaining of respiratory symptoms for 24 hours.  Primary symptoms are: started abruptly with subjective fever/chills, achy in LB rad around low abdomen, poor appetite.  Woke up today with nasal cong/PND, lots of coughing today.  Worst symptoms seems to be the PND and cough.  Lately the symptoms seem to be worsening: cough.  Aches resolved with one dose of vicodin yesterday and have not returned---just feels fatigued.  Pertinent negatives: No pain in face or teeth.  No significant HA.  ST mild/scratchy at most.    Symptoms made worse by night time, activity.  Symptoms improved by nothing: ventolin. Smoker? no Recent sick contact? Works at [employer] mostly. Muscle or joint aches? Not today. Flu shot this season at least 2 wks ago? No (has horrible site rxn; has intermittent asthma so can't get the flumist)  Additional ROS: no n/v/d or abdominal pain.  No rash.  No neck stiffness.   +Mild fatigue.  +Mild appetite loss.   Pertinent PMH:  Past Medical History  Diagnosis Date  . Epilepsy     medically cleared and not followed by a physician for 16 yrs  . Fifth disease infant  . Chicken pox 7 yrs old  . Anxiety   . Hyperlipidemia 04/24/2011  . Hx of tympanostomy tubes   . Overweight 05/21/2011  . Newly recognized heart murmur 09/12/2011  . Functional dyspepsia 09/12/2011  . Anxiety 04/24/2011  . Reflux 04/18/2012  . Insomnia 04/18/2012  . Bronchitis 07/24/2012    MEDS:  Outpatient Prescriptions Prior to Visit  Medication Sig Dispense Refill  . Artificial Tear Ointment (ARTIFICIAL TEARS) ointment Place into both eyes as needed.        . citalopram (CELEXA) 10 MG tablet Take 1 tablet (10 mg total) by mouth  daily.  30 tablet  2  . ethynodiol-ethinyl estradiol (ZOVIA 1/35E, 28,) 1-35 MG-MCG tablet Take 1 tablet by mouth daily.        Marland Kitchen HYDROcodone-acetaminophen (NORCO) 5-325 MG per tablet Take 1 tablet by mouth as needed.        Marland Kitchen ibuprofen (ADVIL,MOTRIN) 200 MG tablet Take 400 mg by mouth as needed.        . Multiple Vitamin (MULTIVITAMIN) tablet Take 1 tablet by mouth daily.        . nebivolol (BYSTOLIC) 5 MG tablet Take 1 tablet (5 mg total) by mouth daily.  30 tablet  0  . PROAIR HFA 108 (90 BASE) MCG/ACT inhaler INHALE 2 PUFFS INTO THE LUNGS EVERY 6 (SIX) HOURS AS NEEDED FOR WHEEZING.  8.5 each  0  . benzonatate (TESSALON) 200 MG capsule Take 1 capsule (200 mg total) by mouth 3 (three) times daily as needed for cough.  30 capsule  1  . chlorpheniramine-HYDROcodone (TUSSIONEX PENNKINETIC ER) 10-8 MG/5ML LQCR Take 5 mLs by mouth at bedtime as needed.  140 mL  1  . [DISCONTINUED] azithromycin (ZITHROMAX) 250 MG tablet 2 tabs po once and then 1 tab po daily x 4 days  6 tablet  0  . [DISCONTINUED] ranitidine (ZANTAC) 300 MG tablet Take 1 tablet (300 mg total) by mouth at bedtime.  30 tablet  2  Last reviewed on 09/05/2012 11:27 AM by  Jeoffrey Massed, MD  PE: Blood pressure 115/77, pulse 77, temperature 98.8 F (37.1 C), temperature source Temporal, height 5\' 1"  (1.549 m), weight 208 lb (94.348 kg), last menstrual period 08/26/2012, SpO2 97.00%. VS: noted--normal. Gen: alert, NAD, NONTOXIC APPEARING. HEENT: eyes without injection, drainage, or swelling.  Ears: EACs clear, TMs with normal light reflex and landmarks.  Nose: Clear rhinorrhea, with some dried, crusty exudate adherent to mildly injected mucosa.  No purulent d/c.  No paranasal sinus TTP.  No facial swelling.  Throat and mouth without focal lesion.  No pharyngial swelling, erythema, or exudate.   Neck: supple, no LAD.   LUNGS: CTA bilat, nonlabored resps.  Mildly prolonged exp phase and diminished aeration in expiratory phase.  No signif  post-exhalation coughing.  She has a spasm-like cough occasionally/randomly in exam room. CV: RRR, no m/r/g. EXT: no c/c/e SKIN: no rash   IMPRESSION AND PLAN:  1) Influenza-like illness: she has not been vaccinated.  She does work in health care. I recommended tamiflu 75mg  bid x 5d.  She is out of work for the next 3d already. Symptomatic care with tessalon perles daytime, hycodan susp hs.  2) Asthmatic bronchitis: Prednisone 40mg  qd x 5d. Continue ProAir HFA 2 puffs q4h prn.  FOLLOW UP: prn

## 2012-09-17 ENCOUNTER — Ambulatory Visit (INDEPENDENT_AMBULATORY_CARE_PROVIDER_SITE_OTHER): Payer: BC Managed Care – PPO | Admitting: Family Medicine

## 2012-09-17 ENCOUNTER — Encounter: Payer: Self-pay | Admitting: Family Medicine

## 2012-09-17 VITALS — BP 127/83 | HR 82 | Temp 98.4°F | Ht 62.0 in | Wt 212.0 lb

## 2012-09-17 DIAGNOSIS — R05 Cough: Secondary | ICD-10-CM

## 2012-09-17 DIAGNOSIS — R1011 Right upper quadrant pain: Secondary | ICD-10-CM

## 2012-09-17 DIAGNOSIS — R319 Hematuria, unspecified: Secondary | ICD-10-CM

## 2012-09-17 DIAGNOSIS — J40 Bronchitis, not specified as acute or chronic: Secondary | ICD-10-CM

## 2012-09-17 DIAGNOSIS — R059 Cough, unspecified: Secondary | ICD-10-CM

## 2012-09-17 DIAGNOSIS — R109 Unspecified abdominal pain: Secondary | ICD-10-CM

## 2012-09-17 DIAGNOSIS — R5381 Other malaise: Secondary | ICD-10-CM

## 2012-09-17 DIAGNOSIS — R197 Diarrhea, unspecified: Secondary | ICD-10-CM

## 2012-09-17 DIAGNOSIS — R5383 Other fatigue: Secondary | ICD-10-CM

## 2012-09-17 DIAGNOSIS — J45901 Unspecified asthma with (acute) exacerbation: Secondary | ICD-10-CM

## 2012-09-17 LAB — CBC
HCT: 39.9 % (ref 36.0–46.0)
Hemoglobin: 13.5 g/dL (ref 12.0–15.0)
MCV: 93.8 fl (ref 78.0–100.0)
RBC: 4.26 Mil/uL (ref 3.87–5.11)
RDW: 13.1 % (ref 11.5–14.6)
WBC: 10.1 10*3/uL (ref 4.5–10.5)

## 2012-09-17 LAB — POCT URINALYSIS DIPSTICK
Bilirubin, UA: NEGATIVE
Glucose, UA: NEGATIVE
Leukocytes, UA: NEGATIVE
Nitrite, UA: NEGATIVE
pH, UA: 7

## 2012-09-17 LAB — RENAL FUNCTION PANEL
Albumin: 3.6 g/dL (ref 3.5–5.2)
BUN: 16 mg/dL (ref 6–23)
CO2: 26 mEq/L (ref 19–32)
Chloride: 105 mEq/L (ref 96–112)
Creatinine, Ser: 0.7 mg/dL (ref 0.4–1.2)

## 2012-09-17 LAB — HEPATIC FUNCTION PANEL
AST: 20 U/L (ref 0–37)
Albumin: 3.6 g/dL (ref 3.5–5.2)
Alkaline Phosphatase: 40 U/L (ref 39–117)
Total Protein: 6.5 g/dL (ref 6.0–8.3)

## 2012-09-17 LAB — TSH: TSH: 1.16 u[IU]/mL (ref 0.35–5.50)

## 2012-09-17 LAB — LIPASE: Lipase: 18 U/L (ref 11.0–59.0)

## 2012-09-17 MED ORDER — AMOXICILLIN-POT CLAVULANATE 875-125 MG PO TABS: 1.0000 | ORAL_TABLET | Freq: Two times a day (BID) | ORAL | Status: DC

## 2012-09-17 MED ORDER — METHYLPREDNISOLONE 4 MG PO KIT: PACK | ORAL | Status: DC

## 2012-09-17 MED ORDER — PROBIOTIC PO CAPS: ORAL_CAPSULE | ORAL | Status: DC

## 2012-09-17 MED ORDER — BUDESONIDE 180 MCG/ACT IN AEPB: 1.0000 | INHALATION_SPRAY | Freq: Two times a day (BID) | RESPIRATORY_TRACT | Status: DC

## 2012-09-17 MED ORDER — GUAIFENESIN ER 600 MG PO TB12: 600.0000 mg | ORAL_TABLET | Freq: Two times a day (BID) | ORAL | Status: DC

## 2012-09-17 NOTE — Progress Notes (Signed)
Quick Note:  Patient Informed and voiced understanding ______ 

## 2012-09-19 ENCOUNTER — Telehealth: Payer: Self-pay

## 2012-09-19 ENCOUNTER — Ambulatory Visit (HOSPITAL_BASED_OUTPATIENT_CLINIC_OR_DEPARTMENT_OTHER)
Admission: RE | Admit: 2012-09-19 | Discharge: 2012-09-19 | Disposition: A | Discharge status: Home / Self Care | Payer: BC Managed Care – PPO | Source: Ambulatory Visit | Attending: Family Medicine | Admitting: Family Medicine

## 2012-09-19 ENCOUNTER — Other Ambulatory Visit: Payer: Self-pay | Admitting: Family Medicine

## 2012-09-19 DIAGNOSIS — R1011 Right upper quadrant pain: Secondary | ICD-10-CM

## 2012-09-19 DIAGNOSIS — K7689 Other specified diseases of liver: Secondary | ICD-10-CM | POA: Insufficient documentation

## 2012-09-19 DIAGNOSIS — K769 Liver disease, unspecified: Secondary | ICD-10-CM

## 2012-09-19 MED ORDER — HYDROCODONE-ACETAMINOPHEN 5-325 MG PO TABS: 1.0000 | ORAL_TABLET | Freq: Three times a day (TID) | ORAL | Status: DC | PRN

## 2012-09-19 NOTE — Telephone Encounter (Signed)
Please advise 

## 2012-09-19 NOTE — Telephone Encounter (Signed)
RX faxed . Pt informed.

## 2012-09-19 NOTE — Telephone Encounter (Signed)
ordered

## 2012-09-19 NOTE — Telephone Encounter (Signed)
Per MD inform pt gallbladder ok but theres a 3 cm lesion on liver (likely benign) but they recommend an MRI to make sure.   Pt informed and would like to proceed with the MRI. Please order

## 2012-09-19 NOTE — Telephone Encounter (Signed)
OK to give Norco 5/325 1 tab po q 8 hours severe pain, disp #30, 1 rf

## 2012-09-20 ENCOUNTER — Other Ambulatory Visit (HOSPITAL_BASED_OUTPATIENT_CLINIC_OR_DEPARTMENT_OTHER): Payer: BC Managed Care – PPO

## 2012-09-20 ENCOUNTER — Ambulatory Visit (HOSPITAL_BASED_OUTPATIENT_CLINIC_OR_DEPARTMENT_OTHER)
Admission: RE | Admit: 2012-09-20 | Discharge: 2012-09-20 | Disposition: A | Discharge status: Home / Self Care | Payer: BC Managed Care – PPO | Source: Ambulatory Visit | Attending: Family Medicine | Admitting: Family Medicine

## 2012-09-20 ENCOUNTER — Encounter: Payer: Self-pay | Admitting: Family Medicine

## 2012-09-20 DIAGNOSIS — R1011 Right upper quadrant pain: Secondary | ICD-10-CM

## 2012-09-20 DIAGNOSIS — K769 Liver disease, unspecified: Secondary | ICD-10-CM

## 2012-09-20 DIAGNOSIS — R935 Abnormal findings on diagnostic imaging of other abdominal regions, including retroperitoneum: Secondary | ICD-10-CM | POA: Insufficient documentation

## 2012-09-20 HISTORY — DX: Right upper quadrant pain: R10.11

## 2012-09-20 MED ORDER — GADOBENATE DIMEGLUMINE 529 MG/ML IV SOLN
20.0000 mL | Freq: Once | INTRAVENOUS | Status: AC | PRN
  Administered 2012-09-20: 20 mL via INTRAVENOUS

## 2012-09-20 NOTE — Progress Notes (Signed)
Patient ID: Ashley Chang, female   DOB: 31-Jul-1976, 37 y.o.   MRN: [id number] Ashley Chang [id number] Oct 13, 1975 09/20/2012      Progress Note-Follow Up  Subjective  Chief Complaint  Chief Complaint  Patient presents with  . Wheezing    X 3 days    HPI  Patient is a 37 year old Caucasian female who is in today for evaluation of persistent wheezing. She notes the symptoms are worse when she lies down but occur during the day. They're worse with exertion. No fevers or chills but she is having malaise and myalgias. Albuterol helps but only temporarily. She's using it multiple times a day. Normally she would use it less than once a week. Has some mild head congestion but no ear pain. No headache or chest pain. No palpitations. Cleaning of intermittent right upper quadrant pain but does not note a pattern with eating or not eating. Describes it as sharp and intermittent recently.  Past Medical History  Diagnosis Date  . Epilepsy     medically cleared and not followed by a physician for 16 yrs  . Fifth disease infant  . Chicken pox 7 yrs old  . Anxiety   . Hyperlipidemia 04/24/2011  . Hx of tympanostomy tubes   . Overweight 05/21/2011  . Newly recognized heart murmur 09/12/2011  . Functional dyspepsia 09/12/2011  . Anxiety 04/24/2011  . Reflux 04/18/2012  . Insomnia 04/18/2012  . Bronchitis 07/24/2012  . Asthma with acute exacerbation 08/19/2008    Qualifier: Diagnosis of  By: Andrey Campanile MD, Raliegh Ip     . RUQ pain 09/20/2012    Past Surgical History  Procedure Laterality Date  . Right ankle surgery  2011    removal of tissue and reconstruction    Family History  Problem Relation Age of Onset  . Hypertension Mother   . Hyperlipidemia Father   . Dementia Maternal Grandmother   . Diabetes Paternal Grandmother   . Depression Paternal Grandfather     suicide    History   Social History  . Marital Status: Married    Spouse Name: N/A    Number of Children: N/A  . Years of  Education: N/A   Occupational History  . Not on file.   Social History Main Topics  . Smoking status: Never Smoker   . Smokeless tobacco: Never Used  . Alcohol Use: Yes     Comment: occasionally  . Drug Use: No  . Sexually Active: Yes -- Female partner(s)   Other Topics Concern  . Not on file   Social History Narrative  . No narrative on file    Current Outpatient Prescriptions on File Prior to Visit  Medication Sig Dispense Refill  . Artificial Tear Ointment (ARTIFICIAL TEARS) ointment Place into both eyes as needed.        . citalopram (CELEXA) 10 MG tablet Take 1 tablet (10 mg total) by mouth daily.  30 tablet  2  . ethynodiol-ethinyl estradiol (ZOVIA 1/35E, 28,) 1-35 MG-MCG tablet Take 1 tablet by mouth daily.        Marland Kitchen ibuprofen (ADVIL,MOTRIN) 200 MG tablet Take 400 mg by mouth as needed.        . Multiple Vitamin (MULTIVITAMIN) tablet Take 1 tablet by mouth daily.        . nebivolol (BYSTOLIC) 5 MG tablet Take 1 tablet (5 mg total) by mouth daily.  30 tablet  0  . PROAIR HFA 108 (90 BASE) MCG/ACT inhaler INHALE 2 PUFFS INTO THE  LUNGS EVERY 6 (SIX) HOURS AS NEEDED FOR WHEEZING.  8.5 each  0  . benzonatate (TESSALON) 200 MG capsule Take 1 capsule (200 mg total) by mouth 3 (three) times daily as needed for cough.  30 capsule  1  . chlorpheniramine-HYDROcodone (TUSSIONEX PENNKINETIC ER) 10-8 MG/5ML LQCR Take 5 mLs by mouth at bedtime as needed.  140 mL  1  . HYDROcodone-homatropine (HYCODAN) 5-1.5 MG/5ML syrup 1-2 tsp po qhs prn cough/flu-like symptoms  120 mL  0   No current facility-administered medications on file prior to visit.    Allergies  Allergen Reactions  . Maxalt Mlt (Rizatriptan)     hypersomnolence    Review of Systems  Review of Systems  Constitutional: Negative for fever and malaise/fatigue.  HENT: Positive for congestion.   Eyes: Negative for discharge.  Respiratory: Positive for cough, sputum production, shortness of breath and wheezing.    Cardiovascular: Negative for chest pain, palpitations and leg swelling.  Gastrointestinal: Positive for abdominal pain. Negative for heartburn, nausea and diarrhea.  Genitourinary: Negative for dysuria.  Musculoskeletal: Negative for falls.  Skin: Negative for rash.  Neurological: Negative for loss of consciousness and headaches.  Endo/Heme/Allergies: Negative for polydipsia.  Psychiatric/Behavioral: Negative for depression and suicidal ideas. The patient is not nervous/anxious and does not have insomnia.     Objective  BP 127/83  Pulse 82  Temp(Src) 98.4 F (36.9 C) (Temporal)  Ht 5\' 2"  (1.575 m)  Wt 212 lb (96.163 kg)  BMI 38.77 kg/m2  SpO2 97%  LMP 08/26/2012  Physical Exam  Physical Exam  Constitutional: She is oriented to person, place, and time and well-developed, well-nourished, and in no distress. No distress.  HENT:  Head: Normocephalic and atraumatic.  Eyes: Conjunctivae are normal.  Neck: Neck supple. No thyromegaly present.  Cardiovascular: Normal rate, regular rhythm and normal heart sounds.   No murmur heard. Pulmonary/Chest: Effort normal. She has wheezes.  B/l bases mild  Abdominal: She exhibits no distension and no mass.  Musculoskeletal: She exhibits no edema.  Lymphadenopathy:    She has no cervical adenopathy.  Neurological: She is alert and oriented to person, place, and time.  Skin: Skin is warm and dry. No rash noted. She is not diaphoretic.  Psychiatric: Memory, affect and judgment normal.    Lab Results  Component Value Date   TSH 1.16 09/17/2012   Lab Results  Component Value Date   WBC 10.1 09/17/2012   HGB 13.5 09/17/2012   HCT 39.9 09/17/2012   MCV 93.8 09/17/2012   PLT 245.0 09/17/2012   Lab Results  Component Value Date   CREATININE 0.7 09/17/2012   BUN 16 09/17/2012   NA 138 09/17/2012   K 3.9 09/17/2012   CL 105 09/17/2012   CO2 26 09/17/2012   Lab Results  Component Value Date   ALT 19 09/17/2012   AST 20 09/17/2012   ALKPHOS 40 09/17/2012    BILITOT 0.8 09/17/2012   Lab Results  Component Value Date   CHOL 256* 10/03/2011   Lab Results  Component Value Date   HDL 53 10/03/2011   Lab Results  Component Value Date   LDLCALC 183* 10/03/2011   Lab Results  Component Value Date   TRIG 99 10/03/2011   Lab Results  Component Value Date   CHOLHDL 4.8 10/03/2011     Assessment & Plan  Asthma with acute exacerbation Secondary to URI, started on Pulmicort and antibiotics, only use oral steroids if symptoms not improving. Increase rest and  hydration  RUQ pain [place] and MRi negative for stones, noted to have some nonspecific findings in liver. Given Norco to use for pain prn, avoid fatty foods

## 2012-09-20 NOTE — Assessment & Plan Note (Signed)
[place] and MRi negative for stones, noted to have some nonspecific findings in liver. Given Norco to use for pain prn, avoid fatty foods

## 2012-09-20 NOTE — Assessment & Plan Note (Signed)
Secondary to URI, started on Pulmicort and antibiotics, only use oral steroids if symptoms not improving. Increase rest and hydration

## 2012-09-22 ENCOUNTER — Emergency Department (HOSPITAL_COMMUNITY)
Admission: EM | Admit: 2012-09-22 | Discharge: 2012-09-22 | Disposition: A | Discharge status: Home / Self Care | Payer: BC Managed Care – PPO | Attending: Emergency Medicine | Admitting: Emergency Medicine

## 2012-09-22 ENCOUNTER — Telehealth: Payer: Self-pay | Admitting: Family Medicine

## 2012-09-22 ENCOUNTER — Encounter (HOSPITAL_COMMUNITY): Payer: Self-pay

## 2012-09-22 ENCOUNTER — Emergency Department (HOSPITAL_COMMUNITY): Payer: BC Managed Care – PPO

## 2012-09-22 DIAGNOSIS — Z8719 Personal history of other diseases of the digestive system: Secondary | ICD-10-CM | POA: Insufficient documentation

## 2012-09-22 DIAGNOSIS — Z8619 Personal history of other infectious and parasitic diseases: Secondary | ICD-10-CM | POA: Insufficient documentation

## 2012-09-22 DIAGNOSIS — R011 Cardiac murmur, unspecified: Secondary | ICD-10-CM | POA: Insufficient documentation

## 2012-09-22 DIAGNOSIS — R1011 Right upper quadrant pain: Secondary | ICD-10-CM

## 2012-09-22 DIAGNOSIS — J45901 Unspecified asthma with (acute) exacerbation: Secondary | ICD-10-CM | POA: Insufficient documentation

## 2012-09-22 DIAGNOSIS — IMO0002 Reserved for concepts with insufficient information to code with codable children: Secondary | ICD-10-CM | POA: Insufficient documentation

## 2012-09-22 DIAGNOSIS — Z79899 Other long term (current) drug therapy: Secondary | ICD-10-CM | POA: Insufficient documentation

## 2012-09-22 DIAGNOSIS — Z8639 Personal history of other endocrine, nutritional and metabolic disease: Secondary | ICD-10-CM | POA: Insufficient documentation

## 2012-09-22 DIAGNOSIS — F411 Generalized anxiety disorder: Secondary | ICD-10-CM | POA: Insufficient documentation

## 2012-09-22 DIAGNOSIS — Z3202 Encounter for pregnancy test, result negative: Secondary | ICD-10-CM | POA: Insufficient documentation

## 2012-09-22 DIAGNOSIS — Z8669 Personal history of other diseases of the nervous system and sense organs: Secondary | ICD-10-CM | POA: Insufficient documentation

## 2012-09-22 DIAGNOSIS — Z862 Personal history of diseases of the blood and blood-forming organs and certain disorders involving the immune mechanism: Secondary | ICD-10-CM | POA: Insufficient documentation

## 2012-09-22 LAB — COMPREHENSIVE METABOLIC PANEL
ALT: 15 U/L (ref 0–35)
Albumin: 3.3 g/dL — ABNORMAL LOW (ref 3.5–5.2)
Alkaline Phosphatase: 46 U/L (ref 39–117)
BUN: 16 mg/dL (ref 6–23)
Chloride: 102 mEq/L (ref 96–112)
GFR calc Af Amer: >90 mL/min (ref >90)
Glucose, Bld: 87 mg/dL (ref 70–99)
Potassium: 3.6 mEq/L (ref 3.5–5.1)
Sodium: 139 mEq/L (ref 135–145)
Total Bilirubin: 0.3 mg/dL (ref 0.3–1.2)
Total Protein: 6.9 g/dL (ref 6.0–8.3)

## 2012-09-22 LAB — CBC WITH DIFFERENTIAL/PLATELET
Eosinophils Relative: 1 % (ref 0–5)
HCT: 41.3 % (ref 36.0–46.0)
Hemoglobin: 13.5 g/dL (ref 12.0–15.0)
Lymphocytes Relative: 27 % (ref 12–46)
Lymphs Abs: 2.2 10*3/uL (ref 0.7–4.0)
MCV: 94.9 fL (ref 78.0–100.0)
Monocytes Relative: 5 % (ref 3–12)
Neutro Abs: 5.5 10*3/uL (ref 1.7–7.7)
RBC: 4.35 MIL/uL (ref 3.87–5.11)
WBC: 8.2 10*3/uL (ref 4.0–10.5)

## 2012-09-22 LAB — URINALYSIS, ROUTINE W REFLEX MICROSCOPIC
Bilirubin Urine: NEGATIVE
Ketones, ur: NEGATIVE mg/dL
Nitrite: NEGATIVE
Specific Gravity, Urine: 1.028 (ref 1.005–1.030)
pH: 5.5 (ref 5.0–8.0)

## 2012-09-22 LAB — URINE MICROSCOPIC-ADD ON

## 2012-09-22 LAB — LIPASE, BLOOD: Lipase: 26 U/L (ref 11–59)

## 2012-09-22 MED ORDER — MORPHINE SULFATE 4 MG/ML IJ SOLN
4.0000 mg | Freq: Once | INTRAMUSCULAR | Status: AC
  Administered 2012-09-22: 4 mg via INTRAVENOUS
  Filled 2012-09-22: qty 1

## 2012-09-22 MED ORDER — SODIUM CHLORIDE 0.9 % IV BOLUS (SEPSIS)
1000.0000 mL | Freq: Once | INTRAVENOUS | Status: AC
  Administered 2012-09-22: 1000 mL via INTRAVENOUS

## 2012-09-22 MED ORDER — OXYCODONE-ACETAMINOPHEN 5-325 MG PO TABS: 1.0000 | ORAL_TABLET | Freq: Four times a day (QID) | ORAL | Status: DC | PRN

## 2012-09-22 MED ORDER — PROMETHAZINE HCL 25 MG PO TABS: 25.0000 mg | ORAL_TABLET | Freq: Four times a day (QID) | ORAL | Status: DC | PRN

## 2012-09-22 MED ORDER — IOHEXOL 300 MG/ML  SOLN
50.0000 mL | Freq: Once | INTRAMUSCULAR | Status: AC | PRN
  Administered 2012-09-22: 50 mL via ORAL

## 2012-09-22 MED ORDER — IOHEXOL 300 MG/ML  SOLN
80.0000 mL | Freq: Once | INTRAMUSCULAR | Status: AC | PRN
  Administered 2012-09-22: 100 mL via INTRAVENOUS

## 2012-09-22 NOTE — Telephone Encounter (Signed)
Left a message for patient to return my call. 

## 2012-09-22 NOTE — ED Provider Notes (Signed)
History     CSN: [id number]  Arrival date & time 09/22/12  1523   First MD Initiated Contact with Patient 09/22/12 1548      Chief Complaint  Patient presents with  . Abdominal Pain  . Shortness of Breath    (Consider location/radiation/quality/duration/timing/severity/associated sxs/prior treatment) HPI Comments: Patient with one week history of right upper quadrant pain.  She was seen by her pcp and had an ultrasound ordered which showed a lesion on the liver.  This was followed up with an mri which shows what appears to be a benign lesion and likely not the cause of her pain.  She was given vicodin which has helped but the pain continues.  It hurts worse to breathe.  No nausea, vomiting, or diarrhea.    Patient is a 37 y.o. female presenting with abdominal pain. The history is provided by the patient.  Abdominal Pain Pain location:  RUQ Pain quality: squeezing   Pain radiates to:  Does not radiate Pain severity:  Moderate Onset quality:  Gradual Timing:  Constant Progression:  Worsening Chronicity:  New Relieved by:  Nothing Worsened by:  Movement and palpation Ineffective treatments: vicodin has helped somewhat.   Past Medical History  Diagnosis Date  . Epilepsy     medically cleared and not followed by a physician for 16 yrs  . Fifth disease infant  . Chicken pox 7 yrs old  . Anxiety   . Hyperlipidemia 04/24/2011  . Hx of tympanostomy tubes   . Overweight 05/21/2011  . Newly recognized heart murmur 09/12/2011  . Functional dyspepsia 09/12/2011  . Anxiety 04/24/2011  . Reflux 04/18/2012  . Insomnia 04/18/2012  . Bronchitis 07/24/2012  . Asthma with acute exacerbation 08/19/2008    Qualifier: Diagnosis of  By: Andrey Campanile MD, Raliegh Ip     . RUQ pain 09/20/2012    Past Surgical History  Procedure Laterality Date  . Right ankle surgery  2011    removal of tissue and reconstruction    Family History  Problem Relation Age of Onset  . Hypertension Mother   . Hyperlipidemia  Father   . Dementia Maternal Grandmother   . Diabetes Paternal Grandmother   . Depression Paternal Grandfather     suicide    History  Substance Use Topics  . Smoking status: Never Smoker   . Smokeless tobacco: Never Used  . Alcohol Use: Yes     Comment: occasionally    OB History   Grav Para Term Preterm Abortions TAB SAB Ect Mult Living                  Review of Systems  Gastrointestinal: Positive for abdominal pain.  All other systems reviewed and are negative.    Allergies  Maxalt mlt  Home Medications   Current Outpatient Rx  Name  Route  Sig  Dispense  Refill  . amoxicillin-clavulanate (AUGMENTIN) 875-125 MG per tablet   Oral   Take 1 tablet by mouth 2 (two) times daily.   20 tablet   0   . Artificial Tear Ointment (ARTIFICIAL TEARS) ointment   Both Eyes   Place into both eyes as needed.           . benzonatate (TESSALON) 200 MG capsule   Oral   Take 1 capsule (200 mg total) by mouth 3 (three) times daily as needed for cough.   30 capsule   1   . budesonide (PULMICORT FLEXHALER) 180 MCG/ACT inhaler   Inhalation  Inhale 1 puff into the lungs 2 (two) times daily.   1 each   0   . chlorpheniramine-HYDROcodone (TUSSIONEX PENNKINETIC ER) 10-8 MG/5ML LQCR   Oral   Take 5 mLs by mouth at bedtime as needed.   140 mL   1   . citalopram (CELEXA) 10 MG tablet   Oral   Take 1 tablet (10 mg total) by mouth daily.   30 tablet   2   . ethynodiol-ethinyl estradiol (ZOVIA 1/35E, 28,) 1-35 MG-MCG tablet   Oral   Take 1 tablet by mouth daily.           Marland Kitchen guaiFENesin (MUCINEX) 600 MG 12 hr tablet   Oral   Take 1 tablet (600 mg total) by mouth 2 (two) times daily.   20 tablet   0   . HYDROcodone-acetaminophen (NORCO/VICODIN) 5-325 MG per tablet   Oral   Take 1 tablet by mouth every 8 (eight) hours as needed.   30 tablet   1   . HYDROcodone-homatropine (HYCODAN) 5-1.5 MG/5ML syrup      1-2 tsp po qhs prn cough/flu-like symptoms   120 mL    0   . ibuprofen (ADVIL,MOTRIN) 200 MG tablet   Oral   Take 400 mg by mouth as needed.           . methylPREDNISolone (MEDROL DOSEPAK) 4 MG tablet      follow package directions   21 tablet   0   . Multiple Vitamin (MULTIVITAMIN) tablet   Oral   Take 1 tablet by mouth daily.           . nebivolol (BYSTOLIC) 5 MG tablet   Oral   Take 1 tablet (5 mg total) by mouth daily.   30 tablet   0   . PROAIR HFA 108 (90 BASE) MCG/ACT inhaler      INHALE 2 PUFFS INTO THE LUNGS EVERY 6 (SIX) HOURS AS NEEDED FOR WHEEZING.   8.5 each   0   . PROBIOTIC CAPS      Digestive Advantage probiotic gummies by Schiff           BP 140/68  Pulse 77  Temp(Src) 98 F (36.7 C) (Oral)  SpO2 98%  LMP 08/26/2012  Physical Exam  Nursing note and vitals reviewed. Constitutional: She is oriented to person, place, and time. She appears well-developed and well-nourished. No distress.  HENT:  Head: Normocephalic and atraumatic.  Neck: Normal range of motion. Neck supple.  Cardiovascular: Normal rate and regular rhythm.  Exam reveals no gallop and no friction rub.   No murmur heard. Pulmonary/Chest: Effort normal and breath sounds normal. No respiratory distress. She has no wheezes.  Abdominal: Soft. Bowel sounds are normal. She exhibits no distension.  There is ttp in the right upper quadrant with no rebound or guarding.    Musculoskeletal: Normal range of motion.  Neurological: She is alert and oriented to person, place, and time.  Skin: Skin is warm and dry. She is not diaphoretic.    ED Course  Procedures (including critical care time)  Labs Reviewed  CBC WITH DIFFERENTIAL  COMPREHENSIVE METABOLIC PANEL  LIPASE, BLOOD  URINALYSIS, ROUTINE W REFLEX MICROSCOPIC   No results found.   No diagnosis found.    MDM  The patient presents with right upper abdominal pain for the past week but the [place] and MRI have been negative.  Today's ct scan is negative as well.  I am unsure as to  the cause of her symptoms however nothing emergent is coming up.  I will discharge her with percocet, make arrangements for an outpatient HIDA scan to rule out gallbladder dysfunction as the cause.          Geoffery Lyons, MD 09/22/12 2000

## 2012-09-22 NOTE — Telephone Encounter (Signed)
Pt informed of MRI results.

## 2012-09-22 NOTE — Telephone Encounter (Signed)
Pt would like to know what the next step is? Pt states she is still having a lot of SOB and pain. Is a biopsy a good idea or is the MRI enough evidence of this being benign? Please advise?

## 2012-09-22 NOTE — Progress Notes (Signed)
Quick Note:  Patient Informed and voiced understanding ______ 

## 2012-09-22 NOTE — Telephone Encounter (Signed)
Mri is very reassuring. If she wants a second opinion we will happily set her up with Gastroenterology for further consideration. I also could repeat some labs to offer reassurance

## 2012-09-22 NOTE — ED Notes (Signed)
Patient reports that she has had abdominal pain x 5 days ago. Patient was sent for an ultrasound and the US showed a 3 cm lesion on her liver. Patient states she has had abdominal bloating and increased pain since the ultrasound ws done. Patient reports that she was given a prescription for Vicodin and reports that the pain med just takes the edge off of the pain.

## 2012-09-22 NOTE — Telephone Encounter (Signed)
Patient is requesting last test results

## 2012-09-23 NOTE — Telephone Encounter (Signed)
Left another message on pt's voice-mail

## 2012-10-02 ENCOUNTER — Encounter (HOSPITAL_COMMUNITY): Payer: Self-pay

## 2012-10-02 ENCOUNTER — Encounter (HOSPITAL_COMMUNITY)
Admission: RE | Admit: 2012-10-02 | Discharge: 2012-10-02 | Disposition: A | Discharge status: Home / Self Care | Payer: BC Managed Care – PPO | Source: Ambulatory Visit | Attending: Emergency Medicine | Admitting: Emergency Medicine

## 2012-10-02 DIAGNOSIS — R1011 Right upper quadrant pain: Secondary | ICD-10-CM

## 2012-10-02 DIAGNOSIS — R112 Nausea with vomiting, unspecified: Secondary | ICD-10-CM | POA: Insufficient documentation

## 2012-10-02 MED ORDER — TECHNETIUM TC 99M MEBROFENIN IV KIT
5.3000 | PACK | Freq: Once | INTRAVENOUS | Status: AC | PRN
  Administered 2012-10-02: 5.3 via INTRAVENOUS

## 2012-11-12 ENCOUNTER — Encounter: Payer: BC Managed Care – PPO | Admitting: Family Medicine

## 2012-11-17 ENCOUNTER — Encounter: Payer: Self-pay | Admitting: Family Medicine

## 2013-03-13 ENCOUNTER — Encounter: Payer: Self-pay | Admitting: Family Medicine

## 2013-03-13 ENCOUNTER — Ambulatory Visit (HOSPITAL_BASED_OUTPATIENT_CLINIC_OR_DEPARTMENT_OTHER)
Admission: RE | Admit: 2013-03-13 | Discharge: 2013-03-13 | Disposition: A | Discharge status: Home / Self Care | Payer: BC Managed Care – PPO | Source: Ambulatory Visit | Attending: Family Medicine | Admitting: Family Medicine

## 2013-03-13 ENCOUNTER — Ambulatory Visit (INDEPENDENT_AMBULATORY_CARE_PROVIDER_SITE_OTHER): Payer: BC Managed Care – PPO | Admitting: Family Medicine

## 2013-03-13 VITALS — BP 102/78 | HR 66 | Temp 98.3°F | Wt 201.4 lb

## 2013-03-13 DIAGNOSIS — R1011 Right upper quadrant pain: Secondary | ICD-10-CM

## 2013-03-13 DIAGNOSIS — R252 Cramp and spasm: Secondary | ICD-10-CM

## 2013-03-13 DIAGNOSIS — K219 Gastro-esophageal reflux disease without esophagitis: Secondary | ICD-10-CM

## 2013-03-13 DIAGNOSIS — IMO0001 Reserved for inherently not codable concepts without codable children: Secondary | ICD-10-CM

## 2013-03-13 DIAGNOSIS — R110 Nausea: Secondary | ICD-10-CM | POA: Insufficient documentation

## 2013-03-13 DIAGNOSIS — K7689 Other specified diseases of liver: Secondary | ICD-10-CM | POA: Insufficient documentation

## 2013-03-13 LAB — RENAL FUNCTION PANEL
BUN: 16 mg/dL (ref 6–23)
CO2: 26 mEq/L (ref 19–32)
Chloride: 102 mEq/L (ref 96–112)
Creat: 0.84 mg/dL (ref 0.50–1.10)
Glucose, Bld: 80 mg/dL (ref 70–99)
Phosphorus: 2.8 mg/dL (ref 2.3–4.6)
Potassium: 4 mEq/L (ref 3.5–5.3)

## 2013-03-13 LAB — CBC
MCV: 92.4 fL (ref 78.0–100.0)
Platelets: 278 10*3/uL (ref 150–400)
RBC: 4.32 MIL/uL (ref 3.87–5.11)
RDW: 13.3 % (ref 11.5–15.5)
WBC: 10.1 10*3/uL (ref 4.0–10.5)

## 2013-03-13 LAB — HEPATIC FUNCTION PANEL
Alkaline Phosphatase: 49 U/L (ref 39–117)
Bilirubin, Direct: 0.1 mg/dL (ref 0.0–0.3)
Indirect Bilirubin: 0.3 mg/dL (ref 0.0–0.9)
Total Bilirubin: 0.4 mg/dL (ref 0.3–1.2)

## 2013-03-13 MED ORDER — HYDROCODONE-ACETAMINOPHEN 5-325 MG PO TABS: 1.0000 | ORAL_TABLET | Freq: Three times a day (TID) | ORAL | Status: DC | PRN

## 2013-03-13 MED ORDER — ONDANSETRON HCL 4 MG PO TABS: 4.0000 mg | ORAL_TABLET | Freq: Three times a day (TID) | ORAL | Status: DC | PRN

## 2013-03-13 NOTE — Progress Notes (Signed)
Patient ID: Ashley Chang, female   DOB: December 09, 1975, 37 y.o.   MRN: [id number] LENISHA LACAP [id number] March 13, 1976 03/13/2013      Progress Note-Follow Up  Subjective  Chief Complaint  Chief Complaint  Patient presents with  . Abdominal Pain    (R) upper quadrant area    HPI  Patient is a 37 year old Caucasian female who is in today complaining of her current medical quadrant pain. She does have right lower quadrant pain as well. She notes standing up or lying flat he is more comfortable in sitting. She's had anorexia for several days as well as persistent nausea. He has been a struggle to force herself to drink clear liquids. No vomiting. No diarrhea. Has had to strain to move her bowels with laxatives and suppositories for the last 2 days but no bloody or tarry stool. She reports some possible chills but no fevers. No falls or injury. No chest pain, palpitations or shortness of breath or noted. No urinary complaints  Past Medical History  Diagnosis Date  . Epilepsy     medically cleared and not followed by a physician for 16 yrs  . Fifth disease infant  . Chicken pox 7 yrs old  . Anxiety   . Hyperlipidemia 04/24/2011  . Hx of tympanostomy tubes   . Overweight(278.02) 05/21/2011  . Newly recognized heart murmur 09/12/2011  . Functional dyspepsia 09/12/2011  . Anxiety 04/24/2011  . Reflux 04/18/2012  . Insomnia 04/18/2012  . Bronchitis 07/24/2012  . Asthma with acute exacerbation 08/19/2008    Qualifier: Diagnosis of  By: Andrey Campanile MD, Raliegh Ip     . RUQ pain 09/20/2012    Past Surgical History  Procedure Laterality Date  . Right ankle surgery  2011    removal of tissue and reconstruction    Family History  Problem Relation Age of Onset  . Hypertension Mother   . Hyperlipidemia Father   . Dementia Maternal Grandmother   . Diabetes Paternal Grandmother   . Depression Paternal Grandfather     suicide    History   Social History  . Marital Status: Married    Spouse  Name: N/A    Number of Children: N/A  . Years of Education: N/A   Occupational History  . Not on file.   Social History Main Topics  . Smoking status: Never Smoker   . Smokeless tobacco: Never Used  . Alcohol Use: Yes     Comment: occasionally  . Drug Use: No  . Sexually Active: Yes -- Female partner(s)   Other Topics Concern  . Not on file   Social History Narrative  . No narrative on file    Current Outpatient Prescriptions on File Prior to Visit  Medication Sig Dispense Refill  . ALPRAZolam (XANAX) 0.25 MG tablet Take 0.25 mg by mouth at bedtime as needed for sleep (ANXIETY).      . Artificial Tear Ointment (ARTIFICIAL TEARS) ointment Place into both eyes as needed.        . ethynodiol-ethinyl estradiol (ZOVIA 1/35E, 28,) 1-35 MG-MCG tablet Take 1 tablet by mouth daily.        Marland Kitchen ibuprofen (ADVIL,MOTRIN) 200 MG tablet Take 400 mg by mouth as needed (PAIN).       . Multiple Vitamin (MULTIVITAMIN) tablet Take 1 tablet by mouth daily.        Marland Kitchen PROAIR HFA 108 (90 BASE) MCG/ACT inhaler INHALE 2 PUFFS INTO THE LUNGS EVERY 6 (SIX) HOURS AS NEEDED FOR WHEEZING.  8.5  each  0  . PROBIOTIC CAPS Digestive Advantage probiotic gummies by Schiff       No current facility-administered medications on file prior to visit.    Allergies  Allergen Reactions  . Maxalt Mlt (Rizatriptan)     hypersomnolence    Review of Systems  Review of Systems  Constitutional: Positive for chills and malaise/fatigue. Negative for fever.  HENT: Negative for congestion.   Eyes: Negative for pain and discharge.  Respiratory: Negative for shortness of breath.   Cardiovascular: Negative for chest pain, palpitations and leg swelling.  Gastrointestinal: Positive for nausea, abdominal pain and constipation. Negative for vomiting, diarrhea, blood in stool and melena.  Genitourinary: Negative for dysuria.  Musculoskeletal: Negative for falls.  Skin: Negative for rash.  Neurological: Negative for loss of  consciousness and headaches.  Endo/Heme/Allergies: Negative for polydipsia.  Psychiatric/Behavioral: Negative for depression and suicidal ideas. The patient is not nervous/anxious and does not have insomnia.     Objective  BP 102/78  Pulse 66  Temp(Src) 98.3 F (36.8 C) (Oral)  Wt 201 lb 6.4 oz (91.354 kg)  BMI 36.83 kg/m2  SpO2 95%  Physical Exam  Physical Exam  Constitutional: She is oriented to person, place, and time and well-developed, well-nourished, and in no distress. No distress.  HENT:  Head: Normocephalic and atraumatic.  Eyes: Conjunctivae are normal.  Neck: Neck supple. No thyromegaly present.  Cardiovascular: Normal rate, regular rhythm and normal heart sounds.   No murmur heard. Pulmonary/Chest: Effort normal and breath sounds normal. She has no wheezes.  Abdominal: Soft. Bowel sounds are normal. She exhibits no distension and no mass. There is tenderness. There is no rebound and no guarding.  RU and lower quadrant pain with palpation  Musculoskeletal: She exhibits no edema.  Lymphadenopathy:    She has no cervical adenopathy.  Neurological: She is alert and oriented to person, place, and time.  Skin: Skin is warm and dry. No rash noted. She is not diaphoretic.  Psychiatric: Memory, affect and judgment normal.    Lab Results  Component Value Date   TSH 1.16 09/17/2012   Lab Results  Component Value Date   WBC 8.2 09/22/2012   HGB 13.5 09/22/2012   HCT 41.3 09/22/2012   MCV 94.9 09/22/2012   PLT 254 09/22/2012   Lab Results  Component Value Date   CREATININE 0.75 09/22/2012   BUN 16 09/22/2012   NA 139 09/22/2012   K 3.6 09/22/2012   CL 102 09/22/2012   CO2 27 09/22/2012   Lab Results  Component Value Date   ALT 15 09/22/2012   AST 19 09/22/2012   ALKPHOS 46 09/22/2012   BILITOT 0.3 09/22/2012   Lab Results  Component Value Date   CHOL 256* 10/03/2011   Lab Results  Component Value Date   HDL 53 10/03/2011   Lab Results  Component Value Date    LDLCALC 183* 10/03/2011   Lab Results  Component Value Date   TRIG 99 10/03/2011   Lab Results  Component Value Date   CHOLHDL 4.8 10/03/2011     Assessment & Plan  RUQ pain Maintain a bland, clear fluid diet for several days. Low fat. Check an abdominal US. Check labs and given Zofran and Norco to use prn. Seek immediate care if symptoms get notably worse over the weekend  Reflux Not been having consistent trouble lately, is struggling with nausea and anorexia today

## 2013-03-13 NOTE — Assessment & Plan Note (Signed)
Maintain a bland, clear fluid diet for several days. Low fat. Check an abdominal US. Check labs and given Zofran and Norco to use prn. Seek immediate care if symptoms get notably worse over the weekend

## 2013-03-13 NOTE — Assessment & Plan Note (Signed)
Not been having consistent trouble lately, is struggling with nausea and anorexia today

## 2013-03-13 NOTE — Patient Instructions (Addendum)
Appendicitis Appendicitis is when the appendix is swollen (inflamed). The inflammation can lead to developing a hole (perforation) and a collection of pus (abscess). CAUSES  There is not always an obvious cause of appendicitis. Sometimes it is caused by an obstruction in the appendix. The obstruction can be caused by:  A small, hard, pea-sized ball of stool (fecalith).  Enlarged lymph glands in the appendix. SYMPTOMS   Pain around your belly button (navel) that moves toward your lower right belly (abdomen). The pain can become more severe and sharp as time passes.  Tenderness in the lower right abdomen. Pain gets worse if you cough or make a sudden movement.  Feeling sick to your stomach (nauseous).  Throwing up (vomiting).  Loss of appetite.  Fever.  Constipation.  Diarrhea.  Generally not feeling well. DIAGNOSIS   Physical exam.  Blood tests.  Urine test.  X-rays or a CT scan may confirm the diagnosis. TREATMENT  Once the diagnosis of appendicitis is made, the most common treatment is to remove the appendix as soon as possible. This procedure is called appendectomy. In an open appendectomy, a cut (incision) is made in the lower right abdomen and the appendix is removed. In a laparoscopic appendectomy, usually 3 small incisions are made. Long, thin instruments and a camera tube are used to remove the appendix. Most patients go home in 24 to 48 hours after appendectomy. In some situations, the appendix may have already perforated and an abscess may have formed. The abscess may have a "wall" around it as seen on a CT scan. In this case, a drain may be placed into the abscess to remove fluid, and you may be treated with antibiotic medicines that kill germs. The medicine is given through a tube in your vein (IV). Once the abscess has resolved, it may or may not be necessary to have an appendectomy. You may need to stay in the hospital longer than 48 hours. Document Released:  07/30/2005 Document Revised: 01/29/2012 Document Reviewed: 10/25/2009 [facility] Patient Information 2014 [place], [place]. Cholelithiasis Cholelithiasis (also called gallstones) is a form of gallbladder disease where gallstones form in your gallbladder. The gallbladder is a non-essential organ that stores bile made in the liver, which helps digest fats. Gallstones begin as small crystals and slowly grow into stones. Gallstone pain occurs when the gallbladder spasms, and a gallstone is blocking the duct. Pain can also occur when a stone passes out of the duct.  Women are more likely to develop gallstones than men. Other factors that increase the risk of gallbladder disease are:  Having multiple pregnancies. Physicians sometimes advise removing diseased gallbladders before future pregnancies.  Obesity.  Diets heavy in fried foods and fat.  Increasing age (older than 80).  Prolonged use of medications containing female hormones.  Diabetes mellitus.  Rapid weight loss.  Family history of gallstones (heredity). SYMPTOMS  Feeling sick to your stomach (nauseous).  Abdominal pain.  Yellowing of the skin (jaundice).  Sudden pain. It may persist from several minutes to several hours.  Worsening pain with deep breathing or when jarred.  Fever.  Tenderness to the touch. In some cases, when gallstones do not move into the bile duct, people have no pain or symptoms. These are called "silent" gallstones. TREATMENT In severe cases, emergency surgery may be required. HOME CARE INSTRUCTIONS   Only take over-the-counter or prescription medicines for pain, discomfort, or fever as directed by your caregiver.  Follow a low-fat diet until seen again. Fat causes the gallbladder  to contract, which can result in pain.  Follow up as instructed. Attacks are almost always recurrent and surgery is usually required for permanent treatment. SEEK IMMEDIATE MEDICAL CARE IF:   Your pain increases and is  not controlled by medications.  You have an oral temperature above 102 F (38.9 C), not controlled by medication.  You develop nausea and vomiting. MAKE SURE YOU:   Understand these instructions.  Will watch your condition.  Will get help right away if you are not doing well or get worse. Document Released: 07/26/2005 Document Revised: 10/22/2011 Document Reviewed: 09/28/2010 [facility] Patient Information 2014 [place], [place].

## 2013-03-14 LAB — TSH: TSH: 1.091 u[IU]/mL (ref 0.350–4.500)

## 2013-03-16 NOTE — Progress Notes (Signed)
Quick Note:  Patient Informed and voiced understanding ______ 

## 2013-03-16 NOTE — Progress Notes (Signed)
Quick Note:  Patient Informed and voiced understanding.  Patient would like to proceed with referral. Pt is still having pain. Please advise? ______

## 2013-03-17 ENCOUNTER — Ambulatory Visit: Payer: BC Managed Care – PPO | Admitting: Family Medicine

## 2013-03-18 ENCOUNTER — Other Ambulatory Visit: Payer: Self-pay | Admitting: Family Medicine

## 2013-03-18 DIAGNOSIS — K769 Liver disease, unspecified: Secondary | ICD-10-CM

## 2013-03-19 ENCOUNTER — Other Ambulatory Visit: Payer: Self-pay | Admitting: *Deleted

## 2013-03-19 DIAGNOSIS — K7689 Other specified diseases of liver: Secondary | ICD-10-CM

## 2013-03-19 NOTE — Addendum Note (Signed)
Addended by: Regis Bill on: 03/19/2013 11:32 AM   Modules accepted: Orders

## 2013-03-19 NOTE — Progress Notes (Signed)
Per radiology, needed Order [in place of referral]; provider informed/SLS

## 2013-03-20 ENCOUNTER — Other Ambulatory Visit: Payer: Self-pay | Admitting: Family Medicine

## 2013-03-20 ENCOUNTER — Telehealth: Payer: Self-pay

## 2013-03-20 DIAGNOSIS — K769 Liver disease, unspecified: Secondary | ICD-10-CM

## 2013-03-20 NOTE — Telephone Encounter (Signed)
Pt left a message stating she has talked to scheduling and they have her doing a nuclear study and she thought she was doing a liver biopsy? Pt wants to make sure this is what MD wants her to do?  Pt also stated she is in a lot more pain and taking a vicodin q 6 hours.  Please advise? Pt also stated to talk to her mom if she answers

## 2013-03-21 NOTE — Telephone Encounter (Signed)
Spoke with patient by phone, she is in agreement to proceed with Nuclear Medicine imaging prior to biopsy if that is the best path. She is concerned bacause her pain is more persistent than it was last time it occurred. Study is scheduled for 03/31/13.

## 2013-03-31 ENCOUNTER — Encounter (HOSPITAL_COMMUNITY): Payer: Self-pay

## 2013-03-31 ENCOUNTER — Encounter (HOSPITAL_COMMUNITY)
Admission: RE | Admit: 2013-03-31 | Discharge: 2013-03-31 | Disposition: A | Discharge status: Home / Self Care | Payer: BC Managed Care – PPO | Source: Ambulatory Visit | Attending: Family Medicine | Admitting: Family Medicine

## 2013-03-31 DIAGNOSIS — K769 Liver disease, unspecified: Secondary | ICD-10-CM

## 2013-03-31 DIAGNOSIS — K7689 Other specified diseases of liver: Secondary | ICD-10-CM | POA: Insufficient documentation

## 2013-03-31 MED ORDER — TECHNETIUM TC 99M SULFUR COLLOID
5.3000 | Freq: Once | INTRAVENOUS | Status: AC | PRN
  Administered 2013-03-31: 5.3 via INTRAVENOUS

## 2013-04-03 ENCOUNTER — Telehealth: Payer: Self-pay

## 2013-04-03 NOTE — Telephone Encounter (Signed)
Patient called asking if Dr Abner Greenspan had received her nuclear med test results from Tuesday.  I informed pt that MD is out of the office until Monday and we would call her next week. Pt voiced understanding

## 2013-04-03 NOTE — Telephone Encounter (Signed)
So she has what we suspected focal nodular hyperplasia. Fairly common non malignant tumor in liver. Most commonly seen in women under 40 on OCP. If she stops her contraceptive pill this will likely resolve. See if she is willing to do this. We can get her referred to gyn if she wants to consider a long term contraceptive options such as copper IUD or she try use barrier method for now while we see if she gets better

## 2013-04-03 NOTE — Telephone Encounter (Signed)
Pt informed and stated she has a gyn follow up in a few weeks so she will discuss more then and discuss with her husband about him getting a vasectomy.

## 2013-04-06 ENCOUNTER — Encounter: Payer: Self-pay | Admitting: Family Medicine

## 2013-04-06 ENCOUNTER — Ambulatory Visit (INDEPENDENT_AMBULATORY_CARE_PROVIDER_SITE_OTHER): Payer: BC Managed Care – PPO | Admitting: Family Medicine

## 2013-04-06 VITALS — BP 117/78 | HR 68 | Temp 98.0°F | Resp 18 | Ht 62.0 in | Wt 207.0 lb

## 2013-04-06 DIAGNOSIS — R609 Edema, unspecified: Secondary | ICD-10-CM | POA: Insufficient documentation

## 2013-04-06 DIAGNOSIS — G43009 Migraine without aura, not intractable, without status migrainosus: Secondary | ICD-10-CM

## 2013-04-06 DIAGNOSIS — E8779 Other fluid overload: Secondary | ICD-10-CM

## 2013-04-06 HISTORY — DX: Migraine without aura, not intractable, without status migrainosus: G43.009

## 2013-04-06 HISTORY — DX: Edema, unspecified: R60.9

## 2013-04-06 LAB — BASIC METABOLIC PANEL
CO2: 27 mEq/L (ref 19–32)
Calcium: 8.9 mg/dL (ref 8.4–10.5)
Chloride: 104 mEq/L (ref 96–112)
Glucose, Bld: 98 mg/dL (ref 70–99)
Potassium: 3.6 mEq/L (ref 3.5–5.1)
Sodium: 137 mEq/L (ref 135–145)

## 2013-04-06 MED ORDER — FUROSEMIDE 20 MG PO TABS: ORAL_TABLET | ORAL | Status: DC

## 2013-04-06 NOTE — Progress Notes (Signed)
OFFICE NOTE  04/06/2013  CC:  Chief Complaint  Patient presents with  . Hospitalization Follow-up  . Shortness of Breath  . Fatigue     HPI: Patient is a 37 y.o. Caucasian female who is here for E.D f/u for recent severe headache while she was in [facility]. The emerg dept was in [facility] in [place], [place], visit date was 04/04/13.  I reviewed records from the visit today. Traveled to horse show and had mild, gradually-building HA all that day, was out in heat (drank lots of water but no electrolyte-rich fluids), took 2h nap after horse show and woke up with severe HA (occipital at onset then spread over top of head to forehead and temples and throbbed) with n/v (and lost control of bladder during one episode of vomiting).  She was dizzy upon standing.  She had tried a robaxin as she usually does in the past but it didn't help. She was taken to the ED and was given 2L IVF and 10 mg MSO4 IV.  She had mildly elevated bp at arrival to ED but subsequent rechecks were normal, HR normal.  She was d/c'd home with a rx for #10 vicodin 5/325, which she says she has not taken any of. No testing was done in the ED: she reports that she declined a head CT. She took a zofran yesterday b/c she was still feeling a bit nauseated. She is concerned today b/c she feels fluid overloaded.  Feels bloating, feels like face, hands, and feet are swollen.  She is urinating fairly normally.  Now with mild HA (2/10 intensity).  No fevers.  NO myalgias, no rash.  No diarrhea. No chest pain, no SOB, no wheezing, no cough. No injury/fall sustained during horse show.   Pertinent PMH:  Past Medical History  Diagnosis Date  . Epilepsy     medically cleared and not followed by a physician for 16 yrs  . Fifth disease infant  . Chicken pox 7 yrs old  . Anxiety   . Hyperlipidemia 04/24/2011  . Hx of tympanostomy tubes   . Overweight(278.02) 05/21/2011  . Newly recognized heart murmur 09/12/2011  . Functional dyspepsia  09/12/2011  . Anxiety 04/24/2011  . Reflux 04/18/2012  . Insomnia 04/18/2012  . Bronchitis 07/24/2012  . Asthma with acute exacerbation 08/19/2008    Qualifier: Diagnosis of  By: Andrey Campanile MD, Raliegh Ip     . RUQ pain 09/20/2012    MEDS:  Outpatient Prescriptions Prior to Visit  Medication Sig Dispense Refill  . ALPRAZolam (XANAX) 0.25 MG tablet Take 0.25 mg by mouth at bedtime as needed for sleep (ANXIETY).      . Artificial Tear Ointment (ARTIFICIAL TEARS) ointment Place into both eyes as needed.        . ethynodiol-ethinyl estradiol (ZOVIA 1/35E, 28,) 1-35 MG-MCG tablet Take 1 tablet by mouth daily.        Marland Kitchen HYDROcodone-acetaminophen (NORCO/VICODIN) 5-325 MG per tablet Take 1 tablet by mouth every 8 (eight) hours as needed.  30 tablet  1  . ibuprofen (ADVIL,MOTRIN) 200 MG tablet Take 400 mg by mouth as needed (PAIN).       . Multiple Vitamin (MULTIVITAMIN) tablet Take 1 tablet by mouth daily.        . ondansetron (ZOFRAN) 4 MG tablet Take 1 tablet (4 mg total) by mouth every 8 (eight) hours as needed for nausea. Does not tolerated Phenergan, excessive sedation  20 tablet  1  . PROAIR HFA 108 (90 BASE)  MCG/ACT inhaler INHALE 2 PUFFS INTO THE LUNGS EVERY 6 (SIX) HOURS AS NEEDED FOR WHEEZING.  8.5 each  0  . PROBIOTIC CAPS Digestive Advantage probiotic gummies by Schiff       No facility-administered medications prior to visit.    PE: Blood pressure 117/78, pulse 68, temperature 98 F (36.7 C), temperature source Temporal, resp. rate 18, height 5\' 2"  (1.575 m), weight 207 lb (93.895 kg), last menstrual period 03/21/2013, SpO2 99.00%. Gen: Alert, well appearing.  Patient is oriented to person, place, time, and situation. AFFECT: pleasant, lucid thought and speech. ENT: PERRLA, EOMI Eyes: right eye with inferior bulbar conjunctival hemorrhage CV: RRR, no m/r/g.   LUNGS: CTA bilat, nonlabored resps, good aeration in all lung fields. EXT: she has trace pitting pretibial/ankle edema bilat.   Otherwise, I detect no edema (such as face, abd, hands).  IMPRESSION AND PLAN:  1) HA; gradually resolving migraine.  No new meds or diagnostics at this time. 2) Fluid overload--feels it in subQ tissues.  No sign of pulm edema.  No clues of any illness that this may have been a part of; presumably it is related to her 2 U of NS in the ED when she was already euvolemic. Will check electrolytes here today. Will give lasix 20mg  PO x 1 today and then again x 1 tomorrow.  FOLLOW UP: prn

## 2013-04-24 ENCOUNTER — Ambulatory Visit (INDEPENDENT_AMBULATORY_CARE_PROVIDER_SITE_OTHER): Payer: BC Managed Care – PPO | Admitting: Family Medicine

## 2013-04-24 ENCOUNTER — Encounter: Payer: Self-pay | Admitting: Family Medicine

## 2013-04-24 VITALS — BP 100/68 | HR 65 | Temp 98.2°F | Ht 62.0 in | Wt 198.1 lb

## 2013-04-24 DIAGNOSIS — R002 Palpitations: Secondary | ICD-10-CM

## 2013-04-24 DIAGNOSIS — R1011 Right upper quadrant pain: Secondary | ICD-10-CM

## 2013-04-24 DIAGNOSIS — I498 Other specified cardiac arrhythmias: Secondary | ICD-10-CM

## 2013-04-24 DIAGNOSIS — G43009 Migraine without aura, not intractable, without status migrainosus: Secondary | ICD-10-CM

## 2013-04-24 DIAGNOSIS — R0602 Shortness of breath: Secondary | ICD-10-CM

## 2013-04-24 DIAGNOSIS — R001 Bradycardia, unspecified: Secondary | ICD-10-CM

## 2013-04-24 DIAGNOSIS — R609 Edema, unspecified: Secondary | ICD-10-CM

## 2013-04-24 DIAGNOSIS — R109 Unspecified abdominal pain: Secondary | ICD-10-CM

## 2013-04-24 LAB — RENAL FUNCTION PANEL
Albumin: 4.3 g/dL (ref 3.5–5.2)
BUN: 19 mg/dL (ref 6–23)
CO2: 26 mEq/L (ref 19–32)
Glucose, Bld: 69 mg/dL — ABNORMAL LOW (ref 70–99)
Potassium: 3.7 mEq/L (ref 3.5–5.3)
Sodium: 140 mEq/L (ref 135–145)

## 2013-04-24 LAB — SEDIMENTATION RATE: Sed Rate: 1 mm/hr (ref 0–22)

## 2013-04-24 LAB — HEPATIC FUNCTION PANEL
Albumin: 4.3 g/dL (ref 3.5–5.2)
Alkaline Phosphatase: 48 U/L (ref 39–117)
Bilirubin, Direct: 0.1 mg/dL (ref 0.0–0.3)
Total Bilirubin: 0.6 mg/dL (ref 0.3–1.2)

## 2013-04-24 LAB — CBC
HCT: 40.1 % (ref 36.0–46.0)
Hemoglobin: 13.6 g/dL (ref 12.0–15.0)
MCH: 32.1 pg (ref 26.0–34.0)
RBC: 4.24 MIL/uL (ref 3.87–5.11)

## 2013-04-24 LAB — MAGNESIUM: Magnesium: 1.8 mg/dL (ref 1.5–2.5)

## 2013-04-24 LAB — TSH: TSH: 1.274 u[IU]/mL (ref 0.350–4.500)

## 2013-04-24 MED ORDER — FUROSEMIDE 20 MG PO TABS: ORAL_TABLET | ORAL | Status: DC

## 2013-04-24 NOTE — Patient Instructions (Addendum)
Bradycardia Bradycardia is a term for a heart rate (pulse) that, in adults, is slower than 60 beats per minute. A normal rate is 60 to 100 beats per minute. A heart rate below 60 beats per minute may be normal for some adults with healthy hearts. If the rate is too slow, the heart may have trouble pumping the volume of blood the body needs. If the heart rate gets too low, blood flow to the brain may be decreased and may make you feel lightheaded, dizzy, or faint. The heart has a natural pacemaker in the top of the heart called the SA node (sinoatrial or sinus node). This pacemaker sends out regular electrical signals to the muscle of the heart, telling the heart muscle when to beat (contract). The electrical signal travels from the upper parts of the heart (atria) through the AV node (atrioventricular node), to the lower chambers of the heart (ventricles). The ventricles squeeze, pumping the blood from your heart to your lungs and to the rest of your body. CAUSES   Problem with the heart's electrical system.  Problem with the heart's natural pacemaker.  Heart disease, damage, or infection.  Medications.  Problems with minerals and salts (electrolytes). SYMPTOMS   Fainting (syncope).  Fatigue and weakness.  Shortness of breath (dyspnea).  Chest pain (angina).  Drowsiness.  Confusion. DIAGNOSIS   An electrocardiogram (ECG) can help your caregiver determine the type of slow heart rate you have.  If the cause is not seen on an ECG, you may need to wear a heart monitor that records your heart rhythm for several hours or days.  Blood tests. TREATMENT   Electrolyte supplements.  Medications.  Withholding medication which is causing a slow heart rate.  Pacemaker placement. SEEK IMMEDIATE MEDICAL CARE IF:   You feel lightheaded or faint.  You develop an irregular heart rate.  You feel chest pain or have trouble breathing. MAKE SURE YOU:   Understand these  instructions.  Will watch your condition.  Will get help right away if you are not doing well or get worse. Document Released: 04/21/2002 Document Revised: 10/22/2011 Document Reviewed: 03/17/2008 [employer] Patient Information 2014 [employer].  

## 2013-04-26 ENCOUNTER — Encounter: Payer: Self-pay | Admitting: Family Medicine

## 2013-04-26 NOTE — Progress Notes (Signed)
Patient ID: Ashley Chang, female   DOB: 09-23-1975, 37 y.o.   MRN: [id number] [facility] [place] [id number] 09-Mar-1976 04/26/2013      Progress Note-Follow Up  Subjective  Chief Complaint  Chief Complaint  Patient presents with  . Follow-up    fluid retention    HPI  Patient is a 37 year old Caucasian female who is in today to discuss multiple symptoms. She continues to struggle with fatigue and malaise. Has had a couple of episodes of severe headache recently. Has frequent headaches and jaw pain but usually sees a chiropractor and this resolves. Had a couple of horseshoe she's had very bad episodes with nausea and even some vomiting myalgias, fatigue and debilitating headache. She's had a couple of episodes of bradycardia leading to dizziness and near-syncope as well as some nausea. 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 He quickly. When this occurs she has increased shortness of breath. She had significant episode of edema where she presents to the hospital recently given Lasix and lost 12 pounds in the next 2 days. She continues to have abdominal pain but it is improved.  Past Medical History  Diagnosis Date  . Epilepsy     medically cleared and not followed by a physician for 16 yrs  . Fifth disease infant  . Chicken pox 7 yrs old  . Anxiety   . Hyperlipidemia 04/24/2011  . Hx of tympanostomy tubes   . Overweight(278.02) 05/21/2011  . Newly recognized heart murmur 09/12/2011  . Functional dyspepsia 09/12/2011  . Anxiety 04/24/2011  . Reflux 04/18/2012  . Insomnia 04/18/2012  . Bronchitis 07/24/2012  . Asthma with acute exacerbation 08/19/2008    Qualifier: Diagnosis of  By: Andrey Campanile MD, Raliegh Ip     . RUQ pain 09/20/2012    Past Surgical History  Procedure Laterality Date  . Right ankle surgery  2011    removal of tissue and reconstruction    Family History  Problem Relation Age of Onset  . Hypertension Mother   . Hyperlipidemia Father   .  Dementia Maternal Grandmother   . Diabetes Paternal Grandmother   . Depression Paternal Grandfather     suicide    History   Social History  . Marital Status: Married    Spouse Name: N/A    Number of Children: N/A  . Years of Education: N/A   Occupational History  . Not on file.   Social History Main Topics  . Smoking status: Never Smoker   . Smokeless tobacco: Never Used  . Alcohol Use: Yes     Comment: occasionally  . Drug Use: No  . Sexual Activity: Yes    Partners: Male   Other Topics Concern  . Not on file   Social History Narrative  . No narrative on file    Current Outpatient Prescriptions on File Prior to Visit  Medication Sig Dispense Refill  . ALPRAZolam (XANAX) 0.25 MG tablet Take 0.25 mg by mouth at bedtime as needed for sleep (ANXIETY).      . Artificial Tear Ointment (ARTIFICIAL TEARS) ointment Place into both eyes as needed.        . ethynodiol-ethinyl estradiol (ZOVIA 1/35E, 28,) 1-35 MG-MCG tablet Take 1 tablet by mouth daily.        Marland Kitchen HYDROcodone-acetaminophen (NORCO/VICODIN) 5-325 MG per tablet Take 1 tablet by mouth every 8 (eight) hours as needed.  30 tablet  1  . ibuprofen (ADVIL,MOTRIN) 200 MG tablet Take  400 mg by mouth as needed (PAIN).       . Multiple Vitamin (MULTIVITAMIN) tablet Take 1 tablet by mouth daily.        . ondansetron (ZOFRAN) 4 MG tablet Take 1 tablet (4 mg total) by mouth every 8 (eight) hours as needed for nausea. Does not tolerated Phenergan, excessive sedation  20 tablet  1  . PROAIR HFA 108 (90 BASE) MCG/ACT inhaler INHALE 2 PUFFS INTO THE LUNGS EVERY 6 (SIX) HOURS AS NEEDED FOR WHEEZING.  8.5 each  0  . PROBIOTIC CAPS Digestive Advantage probiotic gummies by Schiff       No current facility-administered medications on file prior to visit.    Allergies  Allergen Reactions  . Maxalt Mlt [Rizatriptan]     hypersomnolence    Review of Systems  Review of Systems  Constitutional: Positive for malaise/fatigue. Negative  for fever.  HENT: Negative for congestion.   Eyes: Negative for discharge.  Respiratory: Negative for shortness of breath.   Cardiovascular: Negative for chest pain, palpitations and leg swelling.  Gastrointestinal: Positive for abdominal pain. Negative for nausea and diarrhea.  Genitourinary: Negative for dysuria.  Musculoskeletal: Positive for myalgias. Negative for falls.  Skin: Negative for rash.  Neurological: Positive for headaches. Negative for loss of consciousness.  Endo/Heme/Allergies: Negative for polydipsia.  Psychiatric/Behavioral: Negative for depression and suicidal ideas. The patient is not nervous/anxious and does not have insomnia.     Objective  BP 100/68  Pulse 65  Temp(Src) 98.2 F (36.8 C) (Oral)  Ht 5\' 2"  (1.575 m)  Wt 198 lb 1.9 oz (89.867 kg)  BMI 36.23 kg/m2  SpO2 96%  LMP 03/21/2013  Physical Exam  Physical Exam  Constitutional: She is oriented to person, place, and time and well-developed, well-nourished, and in no distress. No distress.  HENT:  Head: Normocephalic and atraumatic.  Eyes: Conjunctivae are normal.  Neck: Neck supple. No thyromegaly present.  Cardiovascular: Normal rate, regular rhythm and normal heart sounds.   No murmur heard. Pulmonary/Chest: Effort normal and breath sounds normal. She has no wheezes.  Abdominal: She exhibits no distension and no mass.  Musculoskeletal: She exhibits no edema.  Lymphadenopathy:    She has no cervical adenopathy.  Neurological: She is alert and oriented to person, place, and time.  Skin: Skin is warm and dry. No rash noted. She is not diaphoretic.  Psychiatric: Memory, affect and judgment normal.    Lab Results  Component Value Date   TSH 1.274 04/24/2013   Lab Results  Component Value Date   WBC 9.7 04/24/2013   HGB 13.6 04/24/2013   HCT 40.1 04/24/2013   MCV 94.6 04/24/2013   PLT 266 04/24/2013   Lab Results  Component Value Date   CREATININE 0.81 04/24/2013   BUN 19 04/24/2013   NA  140 04/24/2013   K 3.7 04/24/2013   CL 104 04/24/2013   CO2 26 04/24/2013   Lab Results  Component Value Date   ALT 17 04/24/2013   AST 17 04/24/2013   ALKPHOS 48 04/24/2013   BILITOT 0.6 04/24/2013   Lab Results  Component Value Date   CHOL 256* 10/03/2011   Lab Results  Component Value Date   HDL 53 10/03/2011   Lab Results  Component Value Date   LDLCALC 183* 10/03/2011   Lab Results  Component Value Date   TRIG 99 10/03/2011   Lab Results  Component Value Date   CHOLHDL 4.8 10/03/2011     Assessment & Plan  Migraine headache without aura Sees a chiropractor which has been helping her headaches but she still has headaches. Has had a couple really bad headaches at recent horse shows when she was dehydrated and over worked.   PALPITATIONS Worsening bradycardia, fatigue and lightheaded episodes with notable SOB. Is set up with cardiology for further consideration. 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

## 2013-04-26 NOTE — Assessment & Plan Note (Addendum)
Sees a chiropractor which has been helping her headaches but she still has headaches. Has had a couple really bad headaches at recent horse shows when she was dehydrated and over worked.

## 2013-04-26 NOTE — Assessment & Plan Note (Addendum)
Worsening bradycardia, fatigue and lightheaded episodes with notable SOB. Is set up with cardiology for further consideration. 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

## 2013-04-26 NOTE — Assessment & Plan Note (Signed)
Improved at this time. 

## 2013-04-27 ENCOUNTER — Ambulatory Visit: Payer: BC Managed Care – PPO | Admitting: Internal Medicine

## 2013-04-29 ENCOUNTER — Other Ambulatory Visit (HOSPITAL_COMMUNITY): Payer: Self-pay | Admitting: Family Medicine

## 2013-04-29 DIAGNOSIS — R0602 Shortness of breath: Secondary | ICD-10-CM

## 2013-04-29 DIAGNOSIS — R001 Bradycardia, unspecified: Secondary | ICD-10-CM

## 2013-05-01 ENCOUNTER — Ambulatory Visit (HOSPITAL_COMMUNITY): Payer: BC Managed Care – PPO | Attending: Family Medicine

## 2013-05-01 ENCOUNTER — Telehealth: Payer: Self-pay

## 2013-05-01 DIAGNOSIS — I059 Rheumatic mitral valve disease, unspecified: Secondary | ICD-10-CM | POA: Insufficient documentation

## 2013-05-01 DIAGNOSIS — I379 Nonrheumatic pulmonary valve disorder, unspecified: Secondary | ICD-10-CM | POA: Insufficient documentation

## 2013-05-01 DIAGNOSIS — I079 Rheumatic tricuspid valve disease, unspecified: Secondary | ICD-10-CM | POA: Insufficient documentation

## 2013-05-01 DIAGNOSIS — R001 Bradycardia, unspecified: Secondary | ICD-10-CM

## 2013-05-01 DIAGNOSIS — R0602 Shortness of breath: Secondary | ICD-10-CM

## 2013-05-01 NOTE — Progress Notes (Signed)
Echocardiogram performed by Matt LeBeau  

## 2013-05-01 NOTE — Telephone Encounter (Signed)
Patient left a message at 4:30 stating that she would like MD to call her. Pt stated she just had an ECho done but has some questions. One being she doesn't know if she should take her Lasix- HR in the 50's but definitely has some edema?  Please advise?

## 2013-05-04 NOTE — Telephone Encounter (Signed)
Spoke with patient reassured her Lasix will not cause bradycardia but she does need to make sure she maintains good protein and electrolytes (Ca/K/Mag) in diet. If edema persists recommend compression hose and elevate feet over Lasix as first line, try 1/2 tab Lasix as needed.

## 2013-05-14 ENCOUNTER — Telehealth: Payer: Self-pay

## 2013-05-14 NOTE — Telephone Encounter (Signed)
Left a message on patients vm stating that she can fax [place] the FMLA paperwork (with her signature and dated) to [phone].

## 2013-05-14 NOTE — Telephone Encounter (Signed)
Patient left a vm stating that her manager suggested that she get FMLA.   Pt would like a call back [phone]

## 2013-05-18 ENCOUNTER — Ambulatory Visit (INDEPENDENT_AMBULATORY_CARE_PROVIDER_SITE_OTHER): Payer: BC Managed Care – PPO | Admitting: Nurse Practitioner

## 2013-05-18 ENCOUNTER — Encounter (INDEPENDENT_AMBULATORY_CARE_PROVIDER_SITE_OTHER): Payer: BC Managed Care – PPO

## 2013-05-18 ENCOUNTER — Encounter: Payer: Self-pay | Admitting: Nurse Practitioner

## 2013-05-18 VITALS — BP 140/80 | HR 60 | Ht 62.0 in | Wt 204.4 lb

## 2013-05-18 DIAGNOSIS — R002 Palpitations: Secondary | ICD-10-CM

## 2013-05-18 NOTE — Progress Notes (Signed)
Ashley Chang Date of Birth: 08-08-1976 Medical Record #[id number]  History of Present Illness: Ashley Chang is seen back today for a work in visit. Seen for Dr. Tenny Craw. Has long history of palpitations. Normal LV function by echo and remote event monitor back in 2013. Other issues include morbid obesity, anxiety, and HLD.   Last seen by Dr. Tenny Craw last February of 2013 - event monitor was placed. This was basically fine with no significant arrhythmia.   Comes back today at the request of her PCP - ? Of bradycardia? Was recently seen there to discuss multiple symptoms which include chronic fatigue and malaise; headaches; and reports a couple of episodes of bradycardia leading to dizziness and near-syncope as well as some nausea. She is a Engineer, civil (consulting) and notes that her pulse can range from 50 to over 100 quickly with associated increased shortness of breath. She had significant episode of edema after getting dehydrated with abdominal pain and was treated with 2 liters of IVF and then had to have lasix to get rid of the swelling (thus the echo was ordered). Using a wrist cuff for her BP.   Comes in today. Here with her mother. Apparently has had focal nodular hyperplasia noted in the liver. Currently with normal LFTs. Tells me that she will be coming off of her BCP due to this liver abnormality - ? Hormone driven. Continues to have palpitations. Continues to have periodic spells of lightheadedness/near syncope. Still feels her heart skipping. Notes HR down the in 40s and up to over 100. Does not exercise on a daily basis but tries to run 1 to 2x per week. She is very involved with her horses. Diet could stand improvement according to her mother. Using too much salt. Too much NSAID use as well reported. Family history positive for early CAD, HTN, AF and HLD. Her lipids are not good from last check 1 1/2 years ago.  Not clear how much caffeine she uses.   Current Outpatient Prescriptions  Medication Sig  Dispense Refill  . ALPRAZolam (XANAX) 0.25 MG tablet Take 0.25 mg by mouth at bedtime as needed for sleep (ANXIETY).      . Artificial Tear Ointment (ARTIFICIAL TEARS) ointment Place into both eyes as needed.        . ethynodiol-ethinyl estradiol (ZOVIA 1/35E, 28,) 1-35 MG-MCG tablet Take 1 tablet by mouth daily.        . furosemide (LASIX) 20 MG tablet 1 tab po qd x 2 days  10 tablet  1  . HYDROcodone-acetaminophen (NORCO/VICODIN) 5-325 MG per tablet Take 1 tablet by mouth every 8 (eight) hours as needed.  30 tablet  1  . ibuprofen (ADVIL,MOTRIN) 200 MG tablet Take 400 mg by mouth as needed (PAIN).       . Multiple Vitamin (MULTIVITAMIN) tablet Take 1 tablet by mouth daily.        . ondansetron (ZOFRAN) 4 MG tablet Take 1 tablet (4 mg total) by mouth every 8 (eight) hours as needed for nausea. Does not tolerated Phenergan, excessive sedation  20 tablet  1  . PROAIR HFA 108 (90 BASE) MCG/ACT inhaler INHALE 2 PUFFS INTO THE LUNGS EVERY 6 (SIX) HOURS AS NEEDED FOR WHEEZING.  8.5 each  0  . PROBIOTIC CAPS Digestive Advantage probiotic gummies by Schiff       No current facility-administered medications for this visit.    Allergies  Allergen Reactions  . Maxalt Mlt [Rizatriptan]     hypersomnolence  Past Medical History  Diagnosis Date  . Epilepsy     medically cleared and not followed by a physician for 16 yrs  . Fifth disease infant  . Chicken pox 7 yrs old  . Anxiety   . Hyperlipidemia 04/24/2011  . Hx of tympanostomy tubes   . Overweight(278.02) 05/21/2011  . Newly recognized heart murmur 09/12/2011  . Functional dyspepsia 09/12/2011  . Anxiety 04/24/2011  . Reflux 04/18/2012  . Insomnia 04/18/2012  . Bronchitis 07/24/2012  . Asthma with acute exacerbation 08/19/2008    Qualifier: Diagnosis of  By: Andrey Campanile MD, Raliegh Ip     . RUQ pain 09/20/2012    Past Surgical History  Procedure Laterality Date  . Right ankle surgery  2011    removal of tissue and reconstruction    History    Smoking status  . Never Smoker   Smokeless tobacco  . Never Used    History  Alcohol Use  . Yes    Comment: occasionally    Family History  Problem Relation Age of Onset  . Hypertension Mother   . Hyperlipidemia Father   . Dementia Maternal Grandmother   . Diabetes Paternal Grandmother   . Depression Paternal Grandfather     suicide    Review of Systems: The review of systems is per the HPI.  All other systems were reviewed and are negative.  Physical Exam: Ht 5\' 2"  (1.575 m)  Wt 204 lb 6.4 oz (92.715 kg)  BMI 37.38 kg/m2 Patient is very pleasant and in no acute distress. Seems a bit anxious. BP was 130/100 by myself with a large cuff. She is obese. Skin is warm and dry. Color is normal.  HEENT is unremarkable. Normocephalic/atraumatic. PERRL. Sclera are nonicteric. Neck is supple. No masses. No JVD. Lungs are clear. Cardiac exam shows a regular rate and rhythm. Abdomen is soft. Extremities are without edema. Gait and ROM are intact. No gross neurologic deficits noted.  LABORATORY DATA: EKG from PCP earlier in September was reviewed - NSR - no acute changes.   Lab Results  Component Value Date   WBC 9.7 04/24/2013   HGB 13.6 04/24/2013   HCT 40.1 04/24/2013   PLT 266 04/24/2013   GLUCOSE 69* 04/24/2013   CHOL 256* 10/03/2011   TRIG 99 10/03/2011   HDL 53 10/03/2011   LDLCALC 183* 10/03/2011   ALT 17 04/24/2013   AST 17 04/24/2013   NA 140 04/24/2013   K 3.7 04/24/2013   CL 104 04/24/2013   CREATININE 0.81 04/24/2013   BUN 19 04/24/2013   CO2 26 04/24/2013   TSH 1.274 04/24/2013    Echo Study Conclusions from September of 2014  - Left ventricle: The cavity size was normal. Wall thickness was increased in a pattern of mild LVH. Systolic function was normal. The estimated ejection fraction was in the range of 50% to 55%. Wall motion was normal; there were no regional wall motion abnormalities. Left ventricular diastolic function parameters were normal. - Aortic valve:  Valve area: 2.04cm^2(VTI). Valve area: 2.06cm^2 (Vmax). - Mitral valve: Mild regurgitation. - Left atrium: The atrium was mildly dilated. - Pulmonary arteries: Systolic pressure was mildly increased.  Assessment / Plan:  1. Multitude of somatic complaints - with chronic fatigue/malaise/ha, etc   2. Morbid obesity - needs to start working on her CV risk factors. This would probably help her feel better in a general fashion.   3. Long standing palpitations - negative event monitor in 2013 - continues to have  near syncopal spells with lightheadedness. This does not happen every day. Very sporadic. Will repeat her event monitor.   4. HTN - mild LVH on echo - normal EF - no evidence of diastolic dysfunction noted - she needs to get a new cuff (large) and monitor at home and keep a symptom diary as well . Needs to cut back the salt and NSAID use.   5. HLD - with reported liver abnormality - probably not a candidate for statin - needs fasting labs as well as an A1C. Even more reason to work on CV risk factors.   Will get her back to see Dr. Tenny Craw in 5 weeks for further discussion.   Patient is agreeable to this plan and will call if any problems develop in the interim.   Rosalio Macadamia, RN, ANP-C [facility] [address] [place], [place]  [address]

## 2013-05-18 NOTE — Patient Instructions (Addendum)
Stay on your current medicines but would stop the NSAID use  Get a new BP cuff with a large cuff - monitor your blood pressure and keep a diary  We will place an event monitor today  Fasting labs for later this week - to recheck lipids/A1C  See Dr. Tenny Craw in 5 weeks for discussion  Call the [facility] office at [phone] if you have any questions, problems or concerns.

## 2013-06-11 ENCOUNTER — Encounter: Payer: Self-pay | Admitting: Internal Medicine

## 2013-06-22 ENCOUNTER — Encounter: Payer: Self-pay | Admitting: Internal Medicine

## 2013-06-22 ENCOUNTER — Ambulatory Visit (INDEPENDENT_AMBULATORY_CARE_PROVIDER_SITE_OTHER): Payer: BC Managed Care – PPO | Admitting: Internal Medicine

## 2013-06-22 VITALS — BP 133/87 | HR 84 | Ht 62.0 in | Wt 202.0 lb

## 2013-06-22 DIAGNOSIS — R002 Palpitations: Secondary | ICD-10-CM

## 2013-06-22 NOTE — Patient Instructions (Signed)
Your physician recommends that you schedule a follow-up appointment in: 3 months with Dr. Tenny Craw  Please obtain a UA at hospital.

## 2013-06-22 NOTE — Progress Notes (Signed)
HPI Patient was referred by Dr. Abner Greenspan.  Has long history of intermitt palpitations.  Occasionally associated with chest discomfort   Patient had echo on 09/18/11 that showed normal LV systolic function  Triv TR. She had a neg event monitor in the pas She was seen by L Gerhardt  NOted HR slow in 40s  Feels drained.  Then 100 Some days can run, ride horses.  Does great.   Other days short winded, dizzy.  She has been set up for event monitor.  Allergies  Allergen Reactions  . Maxalt Mlt [Rizatriptan]     hypersomnolence    Current Outpatient Prescriptions  Medication Sig Dispense Refill  . ALPRAZolam (XANAX) 0.25 MG tablet Take 0.25 mg by mouth at bedtime as needed for sleep (ANXIETY).      . Artificial Tear Ointment (ARTIFICIAL TEARS) ointment Place into both eyes as needed.        Marland Kitchen HYDROcodone-acetaminophen (NORCO/VICODIN) 5-325 MG per tablet Take 1 tablet by mouth as needed (for migraine).      . Multiple Vitamin (MULTIVITAMIN) tablet Take 1 tablet by mouth daily.        . ondansetron (ZOFRAN) 4 MG tablet Take 1 tablet (4 mg total) by mouth every 8 (eight) hours as needed for nausea. Does not tolerated Phenergan, excessive sedation  20 tablet  1  . PROAIR HFA 108 (90 BASE) MCG/ACT inhaler INHALE 2 PUFFS INTO THE LUNGS EVERY 6 (SIX) HOURS AS NEEDED FOR WHEEZING.  8.5 each  0  . Probiotic CAPS daily. Digestive Advantage probiotic gummies by Schiff       No current facility-administered medications for this visit.    Past Medical History  Diagnosis Date  . Epilepsy     medically cleared and not followed by a physician for 16 yrs  . Fifth disease infant  . Chicken pox 7 yrs old  . Anxiety   . Hyperlipidemia 04/24/2011  . Hx of tympanostomy tubes   . Overweight(278.02) 05/21/2011  . Newly recognized heart murmur 09/12/2011  . Functional dyspepsia 09/12/2011  . Anxiety 04/24/2011  . Reflux 04/18/2012  . Insomnia 04/18/2012  . Bronchitis 07/24/2012  . Asthma with acute exacerbation  08/19/2008    Qualifier: Diagnosis of  By: Andrey Campanile MD, Raliegh Ip     . RUQ pain 09/20/2012    Past Surgical History  Procedure Laterality Date  . Right ankle surgery  2011    removal of tissue and reconstruction    Family History  Problem Relation Age of Onset  . Hypertension Mother   . Hyperlipidemia Father   . Hypertension Father   . Dementia Maternal Grandmother   . Diabetes Paternal Grandmother   . Depression Paternal Grandfather     suicide  . Heart attack Maternal Aunt     History   Social History  . Marital Status: Married    Spouse Name: N/A    Number of Children: N/A  . Years of Education: N/A   Occupational History  . Not on file.   Social History Main Topics  . Smoking status: Never Smoker   . Smokeless tobacco: Never Used  . Alcohol Use: Yes     Comment: occasionally  . Drug Use: No  . Sexual Activity: Yes    Partners: Male   Other Topics Concern  . Not on file   Social History Narrative  . No narrative on file    Review of Systems:  All systems reviewed.  They are negative to the above problem  except as previously stated.  Vital Signs: BP 134/86  Pulse 64  Ht 5\' 2"  (1.575 m)  Wt 202 lb (91.627 kg)  BMI 36.94 kg/m2  Physical Exam Patient is a morbidly obese 37 yoand  is in NAD HEENT:  Normocephalic, atraumatic. EOMI, PERRLA.  Neck: JVP is normal. No thyromegaly. No bruits.  Lungs: clear to auscultation. No rales no wheezes.  Heart: Regular rate and rhythm. Normal S1, S2. No S3.   No significant murmurs. PMI not displaced.  Abdomen:  Supple, nontender. Normal bowel sounds. No masses. No hepatomegaly.  Extremities:   Good distal pulses throughout. No lower extremity edema.  Musculoskeletal :moving all extremities.  Neuro:   alert and oriented x3.  CN II-XII grossly intact.  EKG:  SR.  64 bpm     Assessment and Plan:  1.  Bradycardia  I am not convinced that she has symptomatic breadycardia  No signif pauses on event monitor  She has at  times ST up to 170sl She may have a hypervagal response at times and finds her HR is lower.  Mild dysautonomia I would recomm the patient increase fluid and salt intake  Stay active. Will set follow up  Check UA to see if drinking adequate fluids.

## 2013-06-26 ENCOUNTER — Encounter: Payer: Self-pay | Admitting: Family Medicine

## 2013-06-26 ENCOUNTER — Ambulatory Visit (INDEPENDENT_AMBULATORY_CARE_PROVIDER_SITE_OTHER): Payer: BC Managed Care – PPO | Admitting: Family Medicine

## 2013-06-26 VITALS — BP 108/78 | HR 68 | Temp 98.0°F | Ht 62.0 in | Wt 206.1 lb

## 2013-06-26 DIAGNOSIS — F411 Generalized anxiety disorder: Secondary | ICD-10-CM

## 2013-06-26 DIAGNOSIS — R109 Unspecified abdominal pain: Secondary | ICD-10-CM

## 2013-06-26 DIAGNOSIS — F418 Other specified anxiety disorders: Secondary | ICD-10-CM

## 2013-06-26 DIAGNOSIS — R1011 Right upper quadrant pain: Secondary | ICD-10-CM

## 2013-06-26 DIAGNOSIS — R609 Edema, unspecified: Secondary | ICD-10-CM

## 2013-06-26 DIAGNOSIS — F341 Dysthymic disorder: Secondary | ICD-10-CM

## 2013-06-26 MED ORDER — FUROSEMIDE 20 MG PO TABS: 20.0000 mg | ORAL_TABLET | Freq: Every day | ORAL | Status: DC | PRN

## 2013-06-26 MED ORDER — HYDROCODONE-ACETAMINOPHEN 5-325 MG PO TABS: 1.0000 | ORAL_TABLET | Freq: Four times a day (QID) | ORAL | Status: DC | PRN

## 2013-06-26 MED ORDER — ALPRAZOLAM 0.25 MG PO TABS: 0.2500 mg | ORAL_TABLET | Freq: Every evening | ORAL | Status: DC | PRN

## 2013-06-26 NOTE — Progress Notes (Signed)
Pre visit review using our clinic review tool, if applicable. No additional management support is needed unless otherwise documented below in the visit note. 

## 2013-06-28 ENCOUNTER — Encounter: Payer: Self-pay | Admitting: Family Medicine

## 2013-06-28 NOTE — Assessment & Plan Note (Signed)
Had been doing better after stopping OCP but increased fatigue this week secondary to an excessive work schedule, she may need to miss work intermittently due to flares.

## 2013-06-28 NOTE — Progress Notes (Signed)
Patient ID: Ashley Chang, female   DOB: 05/01/1976, 37 y.o.   MRN: [id number] Ashley Chang [id number] 1976-07-15 06/28/2013      Progress Note-Follow Up  Subjective  Chief Complaint  Chief Complaint  Patient presents with  . Abdominal Pain    HPI  Patient is a 37 year old Caucasian female who is in today for recurrent right upper quadrant pain. She was diagnosed with focal nodular hyperplasia in her liver in the spring and had been doing much better since stopping her RESECTED until this past week. She works an excessive work shifts and without was in her right upper quadrant pain recurred. She's been some shifts of work. No fevers or chills, nausea or vomiting. No change in bowel habits chest pain, palpitations or shortness of breath. Acknowledges she has had increased anxiety secondary to return of her pain  Past Medical History  Diagnosis Date  . Epilepsy     medically cleared and not followed by a physician for 16 yrs  . Fifth disease infant  . Chicken pox 7 yrs old  . Anxiety   . Hyperlipidemia 04/24/2011  . Hx of tympanostomy tubes   . Overweight(278.02) 05/21/2011  . Newly recognized heart murmur 09/12/2011  . Functional dyspepsia 09/12/2011  . Anxiety 04/24/2011  . Reflux 04/18/2012  . Insomnia 04/18/2012  . Bronchitis 07/24/2012  . Asthma with acute exacerbation 08/19/2008    Qualifier: Diagnosis of  By: Andrey Campanile MD, Raliegh Ip     . RUQ pain 09/20/2012    Past Surgical History  Procedure Laterality Date  . Right ankle surgery  2011    removal of tissue and reconstruction    Family History  Problem Relation Age of Onset  . Hypertension Mother   . Hyperlipidemia Father   . Hypertension Father   . Dementia Maternal Grandmother   . Diabetes Paternal Grandmother   . Depression Paternal Grandfather     suicide  . Heart attack Maternal Aunt     History   Social History  . Marital Status: Married    Spouse Name: N/A    Number of Children: N/A  . Years of  Education: N/A   Occupational History  . Not on file.   Social History Main Topics  . Smoking status: Never Smoker   . Smokeless tobacco: Never Used  . Alcohol Use: Yes     Comment: occasionally  . Drug Use: No  . Sexual Activity: Yes    Partners: Male   Other Topics Concern  . Not on file   Social History Narrative  . No narrative on file    Current Outpatient Prescriptions on File Prior to Visit  Medication Sig Dispense Refill  . Artificial Tear Ointment (ARTIFICIAL TEARS) ointment Place into both eyes as needed.        . Multiple Vitamin (MULTIVITAMIN) tablet Take 1 tablet by mouth daily.        . ondansetron (ZOFRAN) 4 MG tablet Take 1 tablet (4 mg total) by mouth every 8 (eight) hours as needed for nausea. Does not tolerated Phenergan, excessive sedation  20 tablet  1  . PROAIR HFA 108 (90 BASE) MCG/ACT inhaler INHALE 2 PUFFS INTO THE LUNGS EVERY 6 (SIX) HOURS AS NEEDED FOR WHEEZING.  8.5 each  0  . Probiotic CAPS daily. Digestive Advantage probiotic gummies by Schiff       No current facility-administered medications on file prior to visit.    Allergies  Allergen Reactions  . Maxalt Mlt [Rizatriptan]  hypersomnolence    Review of Systems  Review of Systems  Constitutional: Negative for fever and malaise/fatigue.  HENT: Negative for congestion.   Eyes: Negative for discharge.  Respiratory: Negative for shortness of breath.   Cardiovascular: Negative for chest pain, palpitations and leg swelling.  Gastrointestinal: Positive for abdominal pain. Negative for nausea and diarrhea.  Genitourinary: Negative for dysuria.  Musculoskeletal: Negative for falls.  Skin: Negative for rash.  Neurological: Negative for loss of consciousness and headaches.  Endo/Heme/Allergies: Negative for polydipsia.  Psychiatric/Behavioral: Negative for depression and suicidal ideas. The patient is nervous/anxious. The patient does not have insomnia.     Objective  BP 108/78  Pulse  68  Temp(Src) 98 F (36.7 C) (Oral)  Ht 5\' 2"  (1.575 m)  Wt 206 lb 1.9 oz (93.495 kg)  BMI 37.69 kg/m2  SpO2 93%  LMP 06/09/2013  Physical Exam  Physical Exam  Constitutional: She is oriented to person, place, and time and well-developed, well-nourished, and in no distress. No distress.  HENT:  Head: Normocephalic and atraumatic.  Eyes: Conjunctivae are normal.  Neck: Neck supple. No thyromegaly present.  Cardiovascular: Normal rate, regular rhythm and normal heart sounds.   No murmur heard. Pulmonary/Chest: Effort normal and breath sounds normal. She has no wheezes.  Abdominal: She exhibits no distension and no mass.  Musculoskeletal: She exhibits no edema.  Lymphadenopathy:    She has no cervical adenopathy.  Neurological: She is alert and oriented to person, place, and time.  Skin: Skin is warm and dry. No rash noted. She is not diaphoretic.  Psychiatric: Memory, affect and judgment normal.    Lab Results  Component Value Date   TSH 1.274 04/24/2013   Lab Results  Component Value Date   WBC 9.7 04/24/2013   HGB 13.6 04/24/2013   HCT 40.1 04/24/2013   MCV 94.6 04/24/2013   PLT 266 04/24/2013   Lab Results  Component Value Date   CREATININE 0.81 04/24/2013   BUN 19 04/24/2013   NA 140 04/24/2013   K 3.7 04/24/2013   CL 104 04/24/2013   CO2 26 04/24/2013   Lab Results  Component Value Date   ALT 17 04/24/2013   AST 17 04/24/2013   ALKPHOS 48 04/24/2013   BILITOT 0.6 04/24/2013   Lab Results  Component Value Date   CHOL 256* 10/03/2011   Lab Results  Component Value Date   HDL 53 10/03/2011   Lab Results  Component Value Date   LDLCALC 183* 10/03/2011   Lab Results  Component Value Date   TRIG 99 10/03/2011   Lab Results  Component Value Date   CHOLHDL 4.8 10/03/2011     Assessment & Plan  RUQ pain Had been doing better after stopping OCP but increased fatigue this week secondary to an excessive work schedule, she may need to miss work intermittently due to  flares.   Depression with anxiety Using Alprazolam prn consider SSRI if stress remains hi

## 2013-06-28 NOTE — Assessment & Plan Note (Signed)
Using Alprazolam prn consider SSRI if stress remains hi

## 2013-06-29 ENCOUNTER — Telehealth: Payer: Self-pay

## 2013-06-29 NOTE — Telephone Encounter (Signed)
We discussed sending her to a hepatologist at [facility] but I cannot remember which she preferred. Just clarify and I will order

## 2013-06-29 NOTE — Telephone Encounter (Signed)
Patient left a message stating that she started feeling better by Sunday afternoon. Pt would like to go ahead and proceed with the referral.  Please advise?

## 2013-06-30 ENCOUNTER — Telehealth: Payer: Self-pay | Admitting: Internal Medicine

## 2013-06-30 DIAGNOSIS — E860 Dehydration: Secondary | ICD-10-CM

## 2013-06-30 NOTE — Telephone Encounter (Signed)
Order faxed to the number provided. Velna Hatchet will call if she does not receive.

## 2013-06-30 NOTE — Telephone Encounter (Signed)
NEW PROBLEM    Patient drop off lab at [facility] this am . They need an  Order to process .    SEND A E-REQ order. Or fax  [phone]

## 2013-06-30 NOTE — Telephone Encounter (Signed)
Left a message on patients answering machine to return my call 

## 2013-07-02 ENCOUNTER — Other Ambulatory Visit: Payer: Self-pay | Admitting: Family Medicine

## 2013-07-02 DIAGNOSIS — R1011 Right upper quadrant pain: Secondary | ICD-10-CM

## 2013-07-02 NOTE — Telephone Encounter (Signed)
Spoke with pt and verified that she would like to go to [facility].

## 2013-07-28 ENCOUNTER — Telehealth: Payer: Self-pay | Admitting: Family Medicine

## 2013-07-28 DIAGNOSIS — R109 Unspecified abdominal pain: Secondary | ICD-10-CM

## 2013-07-28 NOTE — Telephone Encounter (Signed)
Patient states that Dr. Abner Greenspan has been following her for a liver lesion. She states that she has recently had a 6 pound weight gain, abd pain, no nausea. She would like to know what Dr. Abner Greenspan recommend she do. Patient did state that her bp was running a little low.

## 2013-07-28 NOTE — Telephone Encounter (Signed)
FYI Patient states that she went to the doctor at [facility] and they didn't have all her results. They did recommend after finally getting the MRI results to have an MRI w/dye. Although pt stated she hasn't done this yet. Pt does go back though on 12-18. Pt hasn't taken the Lasix although she said she did take a quarter of a tablet this morning. She will follow the directions though she stated  Pt is coming in this afternoon for labs.  Labs ordered

## 2013-07-28 NOTE — Telephone Encounter (Signed)
Please advise 

## 2013-07-28 NOTE — Telephone Encounter (Signed)
Closed on accident

## 2013-07-28 NOTE — Telephone Encounter (Signed)
LMOVM

## 2013-07-28 NOTE — Telephone Encounter (Signed)
When is her appt with [facility]? Has she taken any Lasix if no start with 40 mg once then 20 mg a day for 5 days, if yes then start with 40 mg daily x 5 days. Minimize sodium. When able have her come in for sed rate, renal, hepatic and cbc for abdominal pain

## 2013-08-20 ENCOUNTER — Telehealth: Payer: Self-pay

## 2013-08-20 DIAGNOSIS — Z0000 Encounter for general adult medical examination without abnormal findings: Secondary | ICD-10-CM

## 2013-08-20 NOTE — Telephone Encounter (Signed)
Patient left a message stating her appt with Dr Deal was Ashley Beringncelled and they rescheduled with [facility] w/ Dr Marval Regalavindra? Pt states they would like her to have a CBC, PT/INR, and Cmet done before appt and fax the results to them?  Please advise?

## 2013-08-20 NOTE — Telephone Encounter (Signed)
Ok to order her cbc, cmp and pt/inr for abdominal pain. Let her know i think she should be try avoid Triclosan in our soaps, some studies are suggesting that is contributing to liver disease

## 2013-08-21 NOTE — Telephone Encounter (Signed)
Pt informed and asked for labs to be released to mychart so she can print and take with her

## 2013-08-24 ENCOUNTER — Other Ambulatory Visit: Payer: Self-pay | Admitting: Family Medicine

## 2013-08-24 LAB — COMPLETE METABOLIC PANEL WITH GFR
ALBUMIN: 4.5 g/dL (ref 3.5–5.2)
ALT: 31 U/L (ref 0–35)
AST: 28 U/L (ref 0–37)
Alkaline Phosphatase: 76 U/L (ref 39–117)
BUN: 18 mg/dL (ref 6–23)
CO2: 27 mEq/L (ref 19–32)
Calcium: 9.3 mg/dL (ref 8.4–10.5)
Chloride: 102 mEq/L (ref 96–112)
Creat: 0.81 mg/dL (ref 0.50–1.10)
GFR, Est Non African American: >89 mL/min
GLUCOSE: 84 mg/dL (ref 70–99)
POTASSIUM: 3.9 meq/L (ref 3.5–5.3)
Sodium: 135 mEq/L (ref 135–145)
Total Bilirubin: 0.6 mg/dL (ref 0.3–1.2)
Total Protein: 6.8 g/dL (ref 6.0–8.3)

## 2013-08-24 LAB — CBC
HCT: 41.4 % (ref 36.0–46.0)
Hemoglobin: 13.9 g/dL (ref 12.0–15.0)
MCH: 31.7 pg (ref 26.0–34.0)
MCHC: 33.6 g/dL (ref 30.0–36.0)
MCV: 94.3 fL (ref 78.0–100.0)
Platelets: 247 10*3/uL (ref 150–400)
RBC: 4.39 MIL/uL (ref 3.87–5.11)
RDW: 12.9 % (ref 11.5–15.5)
WBC: 8.1 10*3/uL (ref 4.0–10.5)

## 2013-08-24 LAB — PROTIME-INR
INR: 0.92 (ref <1.50)
Prothrombin Time: 12.3 seconds (ref 11.6–15.2)

## 2013-08-26 ENCOUNTER — Telehealth: Payer: Self-pay | Admitting: Family Medicine

## 2013-08-26 NOTE — Telephone Encounter (Signed)
Called pt back, she is going to drop off Order at front desk this afternoon

## 2013-08-26 NOTE — Telephone Encounter (Signed)
Patient returned to office, verbally & physically upset, stating that she wanted to make sure that Dr. Abner GreenspanBlyth was made aware that this fax was sent over on Tues, 01.13.15 d/t the Urgency of the matter and I acknowledge her wish and explained to her that our PCC/Referral Coordinator also would not be back in the office until tomorrow [01.15.15] morning; but the order was to be placed as STAT and that we would contact her ASAP when we have any tangible information RE: this matter. The [facility] is Dr. Harrie ForemanKadiyala Ravindra, MD and the Dx: Jaquita Rector[reason for referral] is to evaluate for gastritis and/or ulcer [RUQ pain]/SLS

## 2013-08-26 NOTE — Telephone Encounter (Signed)
Yes, pt states it was suppose to be done with our drs. I have not [facility].

## 2013-08-26 NOTE — Telephone Encounter (Signed)
Pt states order from [facility] for EGD was sent over yesterday, states this was suppose to be stat. Please advise.

## 2013-08-26 NOTE — Telephone Encounter (Signed)
I can put the order in just see if you can get the nam e of the [facility] and for what daignosis and I will include that in the referral

## 2013-08-26 NOTE — Telephone Encounter (Signed)
This is the first I am seeing of this. Is she saying [facility] and needs an order from me for that system? I do not know how to do that. The gastroenterologists in our system get a blank referral from me to them and then they schedule the EGD I never put in an order. Or is she saying she needs an EGD in our system and if so then I need to refer urgently to our gastroenterologists and they will schedule

## 2013-08-27 ENCOUNTER — Encounter: Payer: Self-pay | Admitting: Internal Medicine

## 2013-08-27 ENCOUNTER — Encounter: Payer: Self-pay | Admitting: Gastroenterology

## 2013-08-27 ENCOUNTER — Other Ambulatory Visit: Payer: Self-pay | Admitting: Family Medicine

## 2013-08-27 ENCOUNTER — Ambulatory Visit: Payer: BC Managed Care – PPO | Admitting: Nurse Practitioner

## 2013-08-27 ENCOUNTER — Ambulatory Visit (INDEPENDENT_AMBULATORY_CARE_PROVIDER_SITE_OTHER): Payer: BC Managed Care – PPO | Admitting: Gastroenterology

## 2013-08-27 ENCOUNTER — Ambulatory Visit (AMBULATORY_SURGERY_CENTER): Payer: BC Managed Care – PPO | Admitting: Internal Medicine

## 2013-08-27 VITALS — BP 118/76 | HR 68 | Ht 63.0 in | Wt 208.0 lb

## 2013-08-27 VITALS — BP 135/77 | HR 68 | Temp 97.4°F | Resp 12 | Ht 63.0 in | Wt 208.0 lb

## 2013-08-27 DIAGNOSIS — R1011 Right upper quadrant pain: Secondary | ICD-10-CM

## 2013-08-27 DIAGNOSIS — K769 Liver disease, unspecified: Secondary | ICD-10-CM

## 2013-08-27 DIAGNOSIS — K7689 Other specified diseases of liver: Secondary | ICD-10-CM

## 2013-08-27 HISTORY — DX: Liver disease, unspecified: K76.9

## 2013-08-27 MED ORDER — SODIUM CHLORIDE 0.9 % IV SOLN: 500.0000 mL | INTRAVENOUS | Status: DC

## 2013-08-27 NOTE — Patient Instructions (Signed)
You have been scheduled for an endoscopy with propofol. Please follow written instructions given to you at your visit today. If you use inhalers (even only as needed), please bring them with you on the day of your procedure. 

## 2013-08-27 NOTE — Progress Notes (Signed)
Patient oob to bathroom, stating on leaving the restroom that she has abdominal gas pain, 6/10. Requested to dress and then lie on left side for a time. Patient belching with some relief.109/56, heart rate 69, sa02 100%, 1649 pain at 4/10. 1653 Patient sitting up in bed drinking warm water. Requesting to go home. 1658  118/69, heart rate 61, sa02 100%.. Patient stating the warm h20 did the trick. Discussed gas relieving measures for home. 3/10 pain now..patient dc to home.

## 2013-08-27 NOTE — Patient Instructions (Signed)
YOU HAD AN ENDOSCOPIC PROCEDURE TODAY AT THE [facility]: Refer to the procedure report that was given to you for any specific questions about what was found during the examination.  If the procedure report does not answer your questions, please call your gastroenterologist to clarify.  If you requested that your care partner not be given the details of your procedure findings, then the procedure report has been included in a sealed envelope for you to review at your convenience later.  YOU SHOULD EXPECT: Some feelings of bloating in the abdomen. Passage of more gas than usual.  Walking can help get rid of the air that was put into your GI tract during the procedure and reduce the bloating. If you had a lower endoscopy (such as a colonoscopy or flexible sigmoidoscopy) you may notice spotting of blood in your stool or on the toilet paper. If you underwent a bowel prep for your procedure, then you may not have a normal bowel movement for a few days.  DIET: Your first meal following the procedure should be a light meal and then it is ok to progress to your normal diet.  A half-sandwich or bowl of soup is an example of a good first meal.  Heavy or fried foods are harder to digest and may make you feel nauseous or bloated.  Likewise meals heavy in dairy and vegetables can cause extra gas to form and this can also increase the bloating.  Drink plenty of fluids but you should avoid alcoholic beverages for 24 hours.  ACTIVITY: Your care partner should take you home directly after the procedure.  You should plan to take it easy, moving slowly for the rest of the day.  You can resume normal activity the day after the procedure however you should NOT DRIVE or use heavy machinery for 24 hours (because of the sedation medicines used during the test).    SYMPTOMS TO REPORT IMMEDIATELY: A gastroenterologist can be reached at any hour.  During normal business hours, 8:30 AM to 5:00 PM Monday through Friday,  call [phone].  After hours and on weekends, please call the GI answering service at [phone] who will take a message and have the physician on call contact you.  Following upper endoscopy (EGD)  Vomiting of blood or coffee ground material  New chest pain or pain under the shoulder blades  Painful or persistently difficult swallowing  New shortness of breath  Fever of 100F or higher  Black, tarry-looking stools  FOLLOW UP: If any biopsies were taken you will be contacted by phone or by letter within the next 1-3 weeks.  Call your gastroenterologist if you have not heard about the biopsies in 3 weeks.  Our staff will call the home number listed on your records the next business day following your procedure to check on you and address any questions or concerns that you may have at that time regarding the information given to you following your procedure. This is a courtesy call and so if there is no answer at the home number and we have not heard from you through the emergency physician on call, we will assume that you have returned to your regular daily activities without incident.  SIGNATURES/CONFIDENTIALITY: You and/or your care partner have signed paperwork which will be entered into your electronic medical record.  These signatures attest to the fact that that the information above on your After Visit Summary has been reviewed and is understood.  Full responsibility of   the confidentiality of this discharge information lies with you and/or your care-partner. 

## 2013-08-27 NOTE — Telephone Encounter (Signed)
done

## 2013-08-27 NOTE — Op Note (Signed)
[facility] [address] [place], [id number]   ENDOSCOPY PROCEDURE REPORT  PATIENT: Ashley Chang, Ashley K.  MR#: [id number] BIRTHDATE: 1976-02-14 , 37  yrs. old GENDER: Female ENDOSCOPIST: Beverley FiedlerJay M Carrianne Hyun, MD REFERRED BY:  Harrie ForemanKadiyala Ravindra, MD PROCEDURE DATE:  08/27/2013 PROCEDURE:  EGD, diagnostic ASA CLASS:     Class II INDICATIONS:  abdominal pain in the upper right quadrant. MEDICATIONS: MAC sedation, administered by CRNA and propofol (Diprivan) 150mg  IV TOPICAL ANESTHETIC: Cetacaine Spray  DESCRIPTION OF PROCEDURE: After the risks benefits and alternatives of the procedure were thoroughly explained, informed consent was obtained.  The LB [id number] endoscope was introduced through the mouth and advanced to the second portion of the duodenum. Without limitations.  The instrument was slowly withdrawn as the mucosa was fully examined.    ESOPHAGUS: The mucosa of the esophagus appeared normal.   Normal Z-line  STOMACH: Food was found in the gastric body with prevents complete visualization of the entire gastric mucosa.   The examined mucosa of the stomach appeared normal.  DUODENUM: The duodenal mucosa showed no abnormalities in the bulb and second portion of the duodenum.  Retroflexed views revealed no abnormalities.     The scope was then withdrawn from the patient and the procedure completed.  COMPLICATIONS: There were no complications.  ENDOSCOPIC IMPRESSION: 1.   The mucosa of the esophagus appeared normal 2.   Food residue in the gastric body with precluded complete visualization, raising question of gastroparesis 3.   The examined mucosa of the stomach appeared normal 4.   The duodenal mucosa showed no abnormalities in the bulb and second portion of the duodenum  RECOMMENDATIONS: Follow-up with Dr.  Marval Regalavindra.  Gastric emptying study could be considered to formally exclude gastroparesis  eSigned:  Beverley FiedlerJay M Leonell Lobdell, MD 08/27/2013 4:08  PM  CC:The Patient; Harrie ForemanKadiyala Ravindra, MD; Clair GullingAnna Mae Diehl, MD

## 2013-08-27 NOTE — Progress Notes (Addendum)
08/27/2013 Ashley LaurelCarolyn K Fahmy [id number] 06-Nov-1975   HISTORY OF PRESENT ILLNESS:  This is a 38 year old female who is new to our practice but has been referred here by [facility] in order to undergo an EGD. She reports that since last February she's been having intermittent episodes of right upper quadrant abdominal pain. The last few months the pain has been constant and getting worse in intensity.  It does cycle through times of which the pain is worse than others. She has undergone extensive evaluation including ultrasounds, MRI's, and HIDA scan. These were negative for gallbladder disease. She was found to have liver lesions on these imaging studies consistent with adenomas or focal nodular hyperplasia, however. Her most recent MRI on 08/04/2013 showed 3 hepatic lesions demonstrating imaging characteristics favoring adenomas and atypical FNH. Dr. Marval Regalavindra is requesting that she undergo EGD for complete evaluation prior to proceeding with liver resection.   Past Medical History  Diagnosis Date  . Epilepsy     medically cleared and not followed by a physician for 16 yrs  . Fifth disease infant  . Chicken pox 7 yrs old  . Anxiety   . Hyperlipidemia 04/24/2011  . Hx of tympanostomy tubes   . Overweight 05/21/2011  . Newly recognized heart murmur 09/12/2011  . Functional dyspepsia 09/12/2011  . Anxiety 04/24/2011  . Reflux 04/18/2012  . Insomnia 04/18/2012  . Bronchitis 07/24/2012  . Asthma with acute exacerbation 08/19/2008    Qualifier: Diagnosis of  By: Andrey CampanileWilson MD, Raliegh IpLauraLee     . RUQ pain 09/20/2012   Past Surgical History  Procedure Laterality Date  . Ankle surgery Right 02/2010    removal of tissue and reconstruction  . Tonsillectomy  1996    reports that she has never smoked. She has never used smokeless tobacco. She reports that she drinks alcohol. She reports that she does not use illicit drugs. family history includes Dementia in her maternal grandmother; Depression in her  paternal grandfather; Diabetes in her paternal grandmother; Heart attack in her maternal aunt; Hyperlipidemia in her father; Hypertension in her father and mother. Allergies  Allergen Reactions  . Maxalt Mlt [Rizatriptan]     hypersomnolence  . Latex Itching and Rash    Prolonged exposure      Outpatient Encounter Prescriptions as of 08/27/2013  Medication Sig  . ALPRAZolam (XANAX) 0.25 MG tablet Take 0.25 mg by mouth. Three times a day bedtime  . Artificial Tear Ointment (ARTIFICIAL TEARS) ointment Place into both eyes as needed.    . furosemide (LASIX) 20 MG tablet Take 1 tablet (20 mg total) by mouth daily as needed for fluid or edema.  Marland Kitchen. HYDROcodone-acetaminophen (NORCO/VICODIN) 5-325 MG per tablet Take 1 tablet by mouth every 6 (six) hours as needed for severe pain (for migraine).  . Multiple Vitamin (MULTIVITAMIN) tablet Take 1 tablet by mouth daily.    . ondansetron (ZOFRAN) 4 MG tablet Take 1 tablet (4 mg total) by mouth every 8 (eight) hours as needed for nausea. Does not tolerated Phenergan, excessive sedation  . PROAIR HFA 108 (90 BASE) MCG/ACT inhaler INHALE 2 PUFFS INTO THE LUNGS EVERY 6 (SIX) HOURS AS NEEDED FOR WHEEZING.  . Probiotic CAPS daily. Digestive Advantage probiotic gummies by Schiff  . [DISCONTINUED] ALPRAZolam (XANAX) 0.25 MG tablet Take 1 tablet (0.25 mg total) by mouth at bedtime as needed for sleep (ANXIETY).     REVIEW OF SYSTEMS  : All other systems reviewed and negative except where noted in the History of  Present Illness.   PHYSICAL EXAM: BP 118/76  Pulse 68  Ht 5\' 3"  (1.6 m)  Wt 208 lb (94.348 kg)  BMI 36.85 kg/m2  LMP 08/09/2012 General: Well developed white female in no acute distress Head: Normocephalic and atraumatic Eyes:  Sclerae anicteric, conjunctiva pink. Ears: Normal auditory acuity. Lungs: Clear throughout to auscultation Heart: Regular rate and rhythm Abdomen: Soft, non-distended.  Normal bowel sounds.  RUQ TTP. Musculoskeletal:  Symmetrical with no gross deformities  Skin: No lesions on visible extremities Extremities: No edema  Neurological: Alert oriented x 4, grossly non-focal. Psychological:  Alert and cooperative. Normal mood and affect  ASSESSMENT AND PLAN: -Right upper quadrant abdominal pain:  Intermittent for several months and now constant and worsening in intensity. She does have some liver lesions that are suspicious for hepatic adenomas or atypical FNH.  She has been seen at [facility] by hepatology/liver transplant and they believe that these lesions are causing her pain. They are requesting an EGD, however, to rule out ulcer disease prior to proceeding with liver resection. Will proceed with EGD today since Dr. Rhea Belton has an opening later this afternoon.  The risks, benefits, and alternatives were discussed with the patient and she consents to proceed.   Addendum: Reviewed and agree with initial management. The nature of the procedure, as well as the risks, benefits, and alternatives were carefully and thoroughly reviewed with the patient. Ample time for discussion and questions allowed. The patient understood, was satisfied, and agreed to proceed.    Beverley Fiedler, MD

## 2013-08-28 ENCOUNTER — Telehealth: Payer: Self-pay | Admitting: *Deleted

## 2013-08-28 ENCOUNTER — Telehealth: Payer: Self-pay | Admitting: Internal Medicine

## 2013-08-28 ENCOUNTER — Ambulatory Visit: Payer: BC Managed Care – PPO | Admitting: Nurse Practitioner

## 2013-08-28 DIAGNOSIS — R1011 Right upper quadrant pain: Secondary | ICD-10-CM

## 2013-08-28 DIAGNOSIS — R109 Unspecified abdominal pain: Secondary | ICD-10-CM

## 2013-08-28 NOTE — Telephone Encounter (Signed)
Lm on vm to return call if problems on number pt gave  in admitting. ewm

## 2013-08-31 NOTE — Telephone Encounter (Signed)
Understood about the food and I think gastroparesis is unlikely, but Dr. Marval Regalavindra wanted this before liver resection Test already scheduled next available

## 2013-08-31 NOTE — Telephone Encounter (Signed)
Scheduled pt for GES on 09/09/13 and informed the pt. Pt wants to make sure Dr Rhea BeltonPyrtle is aware she was rushed into the EGD; she is not complaining. She states she works nights and ate a bowl of beef stew around 10:30AM and took a nap before her appt with Doug SouJessica Zehr, PA. An appt was available for the EGD on the same day so she took it. She is having RUQ pain and is scheduled for a Liver Resection, but everything is delaying the procedure. She wants to make sure her mail caused all the problems. Thanks.

## 2013-08-31 NOTE — Telephone Encounter (Signed)
Okay to proceed to GES (4 hour study if possible)

## 2013-08-31 NOTE — Telephone Encounter (Signed)
OK to schedule GES?

## 2013-08-31 NOTE — Telephone Encounter (Signed)
Explained Dr Lauro FranklinPyrtle's explanation to pt who stated understanding.

## 2013-09-09 ENCOUNTER — Ambulatory Visit (HOSPITAL_COMMUNITY)
Admission: RE | Admit: 2013-09-09 | Discharge: 2013-09-09 | Disposition: A | Discharge status: Home / Self Care | Payer: BC Managed Care – PPO | Source: Ambulatory Visit | Attending: Internal Medicine | Admitting: Internal Medicine

## 2013-09-09 DIAGNOSIS — R1011 Right upper quadrant pain: Secondary | ICD-10-CM

## 2013-09-09 DIAGNOSIS — R109 Unspecified abdominal pain: Secondary | ICD-10-CM

## 2013-09-09 MED ORDER — TECHNETIUM TC 99M SULFUR COLLOID
2.2000 | Freq: Once | INTRAVENOUS | Status: AC | PRN
  Administered 2013-09-09: 2.2 via INTRAVENOUS

## 2013-09-10 NOTE — Telephone Encounter (Signed)
Message copied by Oneal GroutSEBASTIAN, JENNIFER S on Thu Sep 10, 2013 12:53 PM ------      Message from: Danise EdgeBLYTH, STACEY A      Created: Thu Aug 27, 2013  7:49 AM       [facility] ------

## 2013-09-19 ENCOUNTER — Encounter (HOSPITAL_BASED_OUTPATIENT_CLINIC_OR_DEPARTMENT_OTHER): Payer: Self-pay | Admitting: Emergency Medicine

## 2013-09-19 ENCOUNTER — Emergency Department (HOSPITAL_BASED_OUTPATIENT_CLINIC_OR_DEPARTMENT_OTHER): Payer: BC Managed Care – PPO

## 2013-09-19 ENCOUNTER — Emergency Department (HOSPITAL_BASED_OUTPATIENT_CLINIC_OR_DEPARTMENT_OTHER)
Admission: EM | Admit: 2013-09-19 | Discharge: 2013-09-19 | Disposition: A | Discharge status: Home / Self Care | Payer: BC Managed Care – PPO | Attending: Emergency Medicine | Admitting: Emergency Medicine

## 2013-09-19 DIAGNOSIS — F411 Generalized anxiety disorder: Secondary | ICD-10-CM | POA: Insufficient documentation

## 2013-09-19 DIAGNOSIS — R011 Cardiac murmur, unspecified: Secondary | ICD-10-CM | POA: Insufficient documentation

## 2013-09-19 DIAGNOSIS — Z91040 Latex allergy status: Secondary | ICD-10-CM | POA: Insufficient documentation

## 2013-09-19 DIAGNOSIS — Z79899 Other long term (current) drug therapy: Secondary | ICD-10-CM | POA: Insufficient documentation

## 2013-09-19 DIAGNOSIS — R110 Nausea: Secondary | ICD-10-CM | POA: Insufficient documentation

## 2013-09-19 DIAGNOSIS — S060XAA Concussion with loss of consciousness status unknown, initial encounter: Secondary | ICD-10-CM

## 2013-09-19 DIAGNOSIS — Z862 Personal history of diseases of the blood and blood-forming organs and certain disorders involving the immune mechanism: Secondary | ICD-10-CM | POA: Insufficient documentation

## 2013-09-19 DIAGNOSIS — J45909 Unspecified asthma, uncomplicated: Secondary | ICD-10-CM | POA: Insufficient documentation

## 2013-09-19 DIAGNOSIS — Y9352 Activity, horseback riding: Secondary | ICD-10-CM | POA: Insufficient documentation

## 2013-09-19 DIAGNOSIS — H538 Other visual disturbances: Secondary | ICD-10-CM | POA: Insufficient documentation

## 2013-09-19 DIAGNOSIS — Z8619 Personal history of other infectious and parasitic diseases: Secondary | ICD-10-CM | POA: Insufficient documentation

## 2013-09-19 DIAGNOSIS — Z8719 Personal history of other diseases of the digestive system: Secondary | ICD-10-CM | POA: Insufficient documentation

## 2013-09-19 DIAGNOSIS — S060X0A Concussion without loss of consciousness, initial encounter: Secondary | ICD-10-CM | POA: Insufficient documentation

## 2013-09-19 DIAGNOSIS — S060X9A Concussion with loss of consciousness of unspecified duration, initial encounter: Secondary | ICD-10-CM

## 2013-09-19 DIAGNOSIS — Z8639 Personal history of other endocrine, nutritional and metabolic disease: Secondary | ICD-10-CM | POA: Insufficient documentation

## 2013-09-19 DIAGNOSIS — Y929 Unspecified place or not applicable: Secondary | ICD-10-CM | POA: Insufficient documentation

## 2013-09-19 MED ORDER — ONDANSETRON HCL 4 MG/2ML IJ SOLN: 4.0000 mg | Freq: Once | INTRAMUSCULAR | Status: DC

## 2013-09-19 MED ORDER — PROMETHAZINE HCL 25 MG/ML IJ SOLN
25.0000 mg | Freq: Once | INTRAMUSCULAR | Status: AC
  Administered 2013-09-19: 25 mg via INTRAVENOUS
  Filled 2013-09-19: qty 1

## 2013-09-19 MED ORDER — SODIUM CHLORIDE 0.9 % IV BOLUS (SEPSIS)
1000.0000 mL | Freq: Once | INTRAVENOUS | Status: AC
  Administered 2013-09-19: 1000 mL via INTRAVENOUS

## 2013-09-19 MED ORDER — PROMETHAZINE HCL 25 MG PO TABS: 25.0000 mg | ORAL_TABLET | Freq: Four times a day (QID) | ORAL | Status: DC | PRN

## 2013-09-19 MED ORDER — HYDROCODONE-ACETAMINOPHEN 5-325 MG PO TABS: 2.0000 | ORAL_TABLET | ORAL | Status: DC | PRN

## 2013-09-19 MED ORDER — MORPHINE SULFATE 4 MG/ML IJ SOLN
4.0000 mg | Freq: Once | INTRAMUSCULAR | Status: AC
  Administered 2013-09-19: 4 mg via INTRAVENOUS
  Filled 2013-09-19: qty 1

## 2013-09-19 MED ORDER — KETOROLAC TROMETHAMINE 30 MG/ML IJ SOLN
30.0000 mg | Freq: Once | INTRAMUSCULAR | Status: AC
  Administered 2013-09-19: 30 mg via INTRAVENOUS
  Filled 2013-09-19: qty 1

## 2013-09-19 NOTE — ED Provider Notes (Signed)
CSN: [id number]     Arrival date & time 09/19/13  1639 History   First MD Initiated Contact with Patient 09/19/13 1643     Chief Complaint  Patient presents with  . Fell Off Horse    (Consider location/radiation/quality/duration/timing/severity/associated sxs/prior Treatment) HPI Comments: Patient is a 38 year old female who presents after falling off her horse earlier today. Patient reports she was riding her horse when the horse bucked and she went over the horse's head and landed on her left side. She was wearing a helmet but did hit her head. No LOC. Patient reports feeling fine after the fall and about 1.5 hours later, she developed a severe headache. The pain is aching, constant and is located in generalized head without radiation. Patient has tried zofran for symptoms without relief. No alleviating/aggravating factors. Patient reports associated nausea and photophobia. Patient denies fever, vomiting, diarrhea, numbness/tingling, weakness, congestion, chest pain, SOB, abdominal pain.      Past Medical History  Diagnosis Date  . Epilepsy     medically cleared and not followed by a physician for 16 yrs  . Fifth disease infant  . Chicken pox 7 yrs old  . Anxiety   . Hyperlipidemia 04/24/2011  . Hx of tympanostomy tubes   . Overweight 05/21/2011  . Newly recognized heart murmur 09/12/2011  . Functional dyspepsia 09/12/2011  . Anxiety 04/24/2011  . Reflux 04/18/2012  . Insomnia 04/18/2012  . Bronchitis 07/24/2012  . Asthma with acute exacerbation 08/19/2008    Qualifier: Diagnosis of  By: Andrey CampanileWilson MD, Raliegh IpLauraLee     . RUQ pain 09/20/2012   Past Surgical History  Procedure Laterality Date  . Ankle surgery Right 02/2010    removal of tissue and reconstruction  . Tonsillectomy  1996   Family History  Problem Relation Age of Onset  . Hypertension Mother   . Hyperlipidemia Father   . Hypertension Father   . Dementia Maternal Grandmother   . Diabetes Paternal Grandmother   . Depression  Paternal Grandfather     suicide  . Heart attack Maternal Aunt   . Colon cancer Neg Hx   . Esophageal cancer Neg Hx   . Stomach cancer Neg Hx   . Rectal cancer Neg Hx    History  Substance Use Topics  . Smoking status: Never Smoker   . Smokeless tobacco: Never Used  . Alcohol Use: Yes     Comment: occasionally   OB History   Grav Para Term Preterm Abortions TAB SAB Ect Mult Living                 Review of Systems  Constitutional: Negative for fever, chills and fatigue.  HENT: Negative for trouble swallowing.   Eyes: Positive for visual disturbance.  Respiratory: Negative for shortness of breath.   Cardiovascular: Negative for chest pain and palpitations.  Gastrointestinal: Positive for nausea. Negative for vomiting, abdominal pain and diarrhea.  Genitourinary: Negative for dysuria and difficulty urinating.  Musculoskeletal: Negative for arthralgias and neck pain.  Skin: Negative for color change.  Neurological: Positive for headaches. Negative for dizziness and weakness.  Psychiatric/Behavioral: Negative for dysphoric mood.    Allergies  Maxalt mlt and Latex  Home Medications   Current Outpatient Rx  Name  Route  Sig  Dispense  Refill  . ALPRAZolam (XANAX) 0.25 MG tablet   Oral   Take 0.25 mg by mouth. Three times a day bedtime         . Artificial Tear Ointment (ARTIFICIAL  TEARS) ointment   Both Eyes   Place into both eyes as needed.           . furosemide (LASIX) 20 MG tablet   Oral   Take 1 tablet (20 mg total) by mouth daily as needed for fluid or edema.   30 tablet   3   . HYDROcodone-acetaminophen (NORCO/VICODIN) 5-325 MG per tablet   Oral   Take 1 tablet by mouth every 6 (six) hours as needed for severe pain (for migraine).   40 tablet   0   . Multiple Vitamin (MULTIVITAMIN) tablet   Oral   Take 1 tablet by mouth daily.           . ondansetron (ZOFRAN) 4 MG tablet   Oral   Take 1 tablet (4 mg total) by mouth every 8 (eight) hours as  needed for nausea. Does not tolerated Phenergan, excessive sedation   20 tablet   1   . PROAIR HFA 108 (90 BASE) MCG/ACT inhaler      INHALE 2 PUFFS INTO THE LUNGS EVERY 6 (SIX) HOURS AS NEEDED FOR WHEEZING.   8.5 each   0   . Probiotic CAPS      daily. Digestive Advantage probiotic gummies by Schiff          BP 132/64  Pulse 72  Temp(Src) 97.6 F (36.4 C) (Oral)  Resp 18  Ht 5\' 2"  (1.575 m)  Wt 205 lb (92.987 kg)  BMI 37.49 kg/m2  SpO2 97%  LMP 09/09/2012 Physical Exam  Nursing note and vitals reviewed. Constitutional: She is oriented to person, place, and time. She appears well-developed and well-nourished. No distress.  HENT:  Head: Normocephalic and atraumatic.  Mouth/Throat: Oropharynx is clear and moist. No oropharyngeal exudate.  Eyes: Conjunctivae and EOM are normal. Pupils are equal, round, and reactive to light.  Neck: Normal range of motion.  Cardiovascular: Normal rate and regular rhythm.  Exam reveals no gallop and no friction rub.   No murmur heard. Pulmonary/Chest: Effort normal and breath sounds normal. She has no wheezes. She has no rales. She exhibits no tenderness.  Abdominal: Soft. She exhibits no distension. There is no tenderness. There is no rebound and no guarding.  Musculoskeletal: Normal range of motion.  No midline spine tenderness to palpation or paraspinal tenderness.   Neurological: She is alert and oriented to person, place, and time. No cranial nerve deficit. Coordination normal.  Extremity strength and sensation equal and intact bilaterally. Speech is goal-oriented. Moves limbs without ataxia.   Skin: Skin is warm and dry.  Psychiatric: She has a normal mood and affect. Her behavior is normal.    ED Course  Procedures (including critical care time) Labs Review Labs Reviewed - No data to display Imaging Review Ct Head Wo Contrast  09/19/2013   CLINICAL DATA:  Fall off horse. Trauma to back of head. Blurred vision. Nausea and  headache. History of asthma.  EXAM: CT HEAD WITHOUT CONTRAST  CT CERVICAL SPINE WITHOUT CONTRAST  TECHNIQUE: Multidetector CT imaging of the head and cervical spine was performed following the standard protocol without intravenous contrast. Multiplanar CT image reconstructions of the cervical spine were also generated.  COMPARISON:  CT HEAD W/O CM dated 05/24/2010  FINDINGS: CT HEAD FINDINGS  Sinuses/Soft tissues: Clear paranasal sinuses and mastoid air cells.  Intracranial: No mass lesion, hemorrhage, hydrocephalus, acute infarct, intra-axial, or extra-axial fluid collection.  CT CERVICAL SPINE FINDINGS  Spinal visualization through the bottom of T1. Prevertebral soft  tissues are within normal limits. No apical pneumothorax. Skull base intact. Mild straightening of expected cervical lordosis. Mild C5-6 spondylosis with endplate osteophytes. Facets are well-aligned. Coronal reformats demonstrate a normal C1-C2 articulation.  IMPRESSION: 1.  No acute intracranial abnormality. 2. No acute cervical spine fracture or subluxation. Spondylosis at C6-7. 3. Straightening of expected cervical lordosis could be positional, due to muscular spasm, or ligamentous injury.   Electronically Signed   By: Jeronimo Greaves M.D.   On: 09/19/2013 18:09   Ct Cervical Spine Wo Contrast  09/19/2013   CLINICAL DATA:  Fall off horse. Trauma to back of head. Blurred vision. Nausea and headache. History of asthma.  EXAM: CT HEAD WITHOUT CONTRAST  CT CERVICAL SPINE WITHOUT CONTRAST  TECHNIQUE: Multidetector CT imaging of the head and cervical spine was performed following the standard protocol without intravenous contrast. Multiplanar CT image reconstructions of the cervical spine were also generated.  COMPARISON:  CT HEAD W/O CM dated 05/24/2010  FINDINGS: CT HEAD FINDINGS  Sinuses/Soft tissues: Clear paranasal sinuses and mastoid air cells.  Intracranial: No mass lesion, hemorrhage, hydrocephalus, acute infarct, intra-axial, or extra-axial  fluid collection.  CT CERVICAL SPINE FINDINGS  Spinal visualization through the bottom of T1. Prevertebral soft tissues are within normal limits. No apical pneumothorax. Skull base intact. Mild straightening of expected cervical lordosis. Mild C5-6 spondylosis with endplate osteophytes. Facets are well-aligned. Coronal reformats demonstrate a normal C1-C2 articulation.  IMPRESSION: 1.  No acute intracranial abnormality. 2. No acute cervical spine fracture or subluxation. Spondylosis at C6-7. 3. Straightening of expected cervical lordosis could be positional, due to muscular spasm, or ligamentous injury.   Electronically Signed   By: Jeronimo Greaves M.D.   On: 09/19/2013 18:09    EKG Interpretation   None       MDM   1. Fall from horse   2. Concussion     6:14 PM CT head and cervical spine unremarkable for acute changes. Patient given IV morphine and phenergan for symptoms. Vitals stable and patient afebrile. No neuro deficits.   6:28 PM Imaging unremarkable. Patient reports improvement of symptoms after morphine and phenergan. Patient likely has a concussion from the fall. Patient will be discharged with vicodin and phenergan for symptoms. Vitals stable and patient afebrile. Patient advised to return with worsening or concerning symptoms.   Emilia Beck, PA-C 09/19/13 1829

## 2013-09-19 NOTE — ED Notes (Signed)
Patient transported to CT 

## 2013-09-19 NOTE — ED Notes (Signed)
[place] collar placed.  Pt tolerated well.

## 2013-09-19 NOTE — ED Notes (Signed)
Riding her horse, bucked off her horse (started bucking and launched her over the horses head), was wearing her helmet.  Landed on left flank and then struck the ground with her head.  Denied LOC, felt OK.  About 90 minutes later, blurred vision, nausea, and headache.

## 2013-09-19 NOTE — Discharge Instructions (Signed)
Take Vicodin as needed for pain. Take Phenergan as needed for nausea. Refer to attached documents for more information. Return to the ED with worsening or concerning symptoms.

## 2013-09-20 NOTE — ED Provider Notes (Signed)
Medical screening examination/treatment/procedure(s) were performed by non-physician practitioner and as supervising physician I was immediately available for consultation/collaboration.  EKG Interpretation   None         Betul Brisky S Aminah Zabawa, MD 09/20/13 1258 

## 2013-10-12 ENCOUNTER — Encounter (HOSPITAL_COMMUNITY): Payer: Self-pay | Admitting: Emergency Medicine

## 2013-10-12 ENCOUNTER — Emergency Department (HOSPITAL_COMMUNITY): Payer: BC Managed Care – PPO

## 2013-10-12 ENCOUNTER — Emergency Department (HOSPITAL_COMMUNITY)
Admission: EM | Admit: 2013-10-12 | Discharge: 2013-10-12 | Disposition: A | Discharge status: Home / Self Care | Payer: BC Managed Care – PPO | Attending: Emergency Medicine | Admitting: Emergency Medicine

## 2013-10-12 DIAGNOSIS — Z9889 Other specified postprocedural states: Secondary | ICD-10-CM | POA: Insufficient documentation

## 2013-10-12 DIAGNOSIS — Z91040 Latex allergy status: Secondary | ICD-10-CM | POA: Insufficient documentation

## 2013-10-12 DIAGNOSIS — Z8669 Personal history of other diseases of the nervous system and sense organs: Secondary | ICD-10-CM | POA: Insufficient documentation

## 2013-10-12 DIAGNOSIS — Z79899 Other long term (current) drug therapy: Secondary | ICD-10-CM | POA: Insufficient documentation

## 2013-10-12 DIAGNOSIS — R011 Cardiac murmur, unspecified: Secondary | ICD-10-CM | POA: Insufficient documentation

## 2013-10-12 DIAGNOSIS — E663 Overweight: Secondary | ICD-10-CM | POA: Insufficient documentation

## 2013-10-12 DIAGNOSIS — R109 Unspecified abdominal pain: Secondary | ICD-10-CM

## 2013-10-12 DIAGNOSIS — Z8619 Personal history of other infectious and parasitic diseases: Secondary | ICD-10-CM | POA: Insufficient documentation

## 2013-10-12 DIAGNOSIS — F411 Generalized anxiety disorder: Secondary | ICD-10-CM | POA: Insufficient documentation

## 2013-10-12 DIAGNOSIS — J45909 Unspecified asthma, uncomplicated: Secondary | ICD-10-CM | POA: Insufficient documentation

## 2013-10-12 DIAGNOSIS — I81 Portal vein thrombosis: Secondary | ICD-10-CM | POA: Insufficient documentation

## 2013-10-12 DIAGNOSIS — K219 Gastro-esophageal reflux disease without esophagitis: Secondary | ICD-10-CM | POA: Insufficient documentation

## 2013-10-12 LAB — COMPREHENSIVE METABOLIC PANEL
ALK PHOS: 90 U/L (ref 39–117)
ALT: 45 U/L — ABNORMAL HIGH (ref 0–35)
AST: 32 U/L (ref 0–37)
Albumin: 3.4 g/dL — ABNORMAL LOW (ref 3.5–5.2)
BILIRUBIN TOTAL: 0.6 mg/dL (ref 0.3–1.2)
BUN: 13 mg/dL (ref 6–23)
CHLORIDE: 98 meq/L (ref 96–112)
CO2: 25 meq/L (ref 19–32)
CREATININE: 0.74 mg/dL (ref 0.50–1.10)
Calcium: 9.6 mg/dL (ref 8.4–10.5)
GFR calc Af Amer: >90 mL/min (ref >90)
Glucose, Bld: 119 mg/dL — ABNORMAL HIGH (ref 70–99)
POTASSIUM: 4.2 meq/L (ref 3.7–5.3)
Sodium: 140 mEq/L (ref 137–147)
Total Protein: 7 g/dL (ref 6.0–8.3)

## 2013-10-12 LAB — URINE MICROSCOPIC-ADD ON

## 2013-10-12 LAB — CBC WITH DIFFERENTIAL/PLATELET
Basophils Absolute: 0 10*3/uL (ref 0.0–0.1)
Basophils Relative: 0 % (ref 0–1)
Eosinophils Absolute: 0.1 10*3/uL (ref 0.0–0.7)
Eosinophils Relative: 0 % (ref 0–5)
HCT: 39 % (ref 36.0–46.0)
HEMOGLOBIN: 13.3 g/dL (ref 12.0–15.0)
LYMPHS PCT: 14 % (ref 12–46)
Lymphs Abs: 2.3 10*3/uL (ref 0.7–4.0)
MCH: 32.4 pg (ref 26.0–34.0)
MCHC: 34.1 g/dL (ref 30.0–36.0)
MCV: 94.9 fL (ref 78.0–100.0)
MONOS PCT: 10 % (ref 3–12)
Monocytes Absolute: 1.5 10*3/uL — ABNORMAL HIGH (ref 0.1–1.0)
NEUTROS ABS: 11.9 10*3/uL — AB (ref 1.7–7.7)
NEUTROS PCT: 76 % (ref 43–77)
Platelets: 291 10*3/uL (ref 150–400)
RBC: 4.11 MIL/uL (ref 3.87–5.11)
RDW: 12.2 % (ref 11.5–15.5)
WBC: 15.8 10*3/uL — AB (ref 4.0–10.5)

## 2013-10-12 LAB — URINALYSIS, ROUTINE W REFLEX MICROSCOPIC
GLUCOSE, UA: NEGATIVE mg/dL
KETONES UR: NEGATIVE mg/dL
LEUKOCYTES UA: NEGATIVE
Nitrite: NEGATIVE
PH: 6 (ref 5.0–8.0)
PROTEIN: 30 mg/dL — AB
Specific Gravity, Urine: 1.043 — ABNORMAL HIGH (ref 1.005–1.030)
Urobilinogen, UA: 1 mg/dL (ref 0.0–1.0)

## 2013-10-12 LAB — HCG, SERUM, QUALITATIVE: Preg, Serum: NEGATIVE

## 2013-10-12 LAB — LIPASE, BLOOD: Lipase: 43 U/L (ref 11–59)

## 2013-10-12 MED ORDER — SODIUM CHLORIDE 0.9 % IV SOLN
1000.0000 mL | INTRAVENOUS | Status: DC
  Administered 2013-10-12: 1000 mL via INTRAVENOUS

## 2013-10-12 MED ORDER — IOHEXOL 300 MG/ML  SOLN
50.0000 mL | Freq: Once | INTRAMUSCULAR | Status: AC | PRN
  Administered 2013-10-12: 50 mL via ORAL

## 2013-10-12 MED ORDER — IOHEXOL 350 MG/ML SOLN
100.0000 mL | Freq: Once | INTRAVENOUS | Status: AC | PRN
  Administered 2013-10-12: 100 mL via INTRAVENOUS

## 2013-10-12 MED ORDER — SODIUM CHLORIDE 0.9 % IV SOLN
1000.0000 mL | Freq: Once | INTRAVENOUS | Status: AC
  Administered 2013-10-12: 1000 mL via INTRAVENOUS

## 2013-10-12 MED ORDER — ONDANSETRON HCL 4 MG/2ML IJ SOLN
4.0000 mg | Freq: Once | INTRAMUSCULAR | Status: AC
  Administered 2013-10-12: 4 mg via INTRAVENOUS
  Filled 2013-10-12: qty 2

## 2013-10-12 MED ORDER — HYDROMORPHONE HCL PF 1 MG/ML IJ SOLN
1.0000 mg | INTRAMUSCULAR | Status: DC | PRN
  Administered 2013-10-12 (×2): 1 mg via INTRAVENOUS
  Filled 2013-10-12 (×2): qty 1

## 2013-10-12 NOTE — ED Provider Notes (Addendum)
CSN: [id number]     Arrival date & time 10/12/13  0007 History   First MD Initiated Contact with Patient 10/12/13 0015     Chief Complaint  Patient presents with  . Tachycardia  . Shortness of Breath   HPI Pt has been having trouble with deep breathing over the past week ever since having a liver resection this past Monday. She was released from [facility] on thursday.  Pt had masses in her liver and had that portion of her liver removed.  However tonight she started to feel very short of breath.  She tried to walk up steps and her heart rate jumped in to the 160s and she felt very short of breath.  She is having pain in her abdomen and it seems worse today after the surgery.  She has felt low grade fevers.  No vomiting or diarrhea.  No chest pain. Past Medical History  Diagnosis Date  . Epilepsy     medically cleared and not followed by a physician for 16 yrs  . Fifth disease infant  . Chicken pox 7 yrs old  . Anxiety   . Hyperlipidemia 04/24/2011  . Hx of tympanostomy tubes   . Overweight 05/21/2011  . Newly recognized heart murmur 09/12/2011  . Functional dyspepsia 09/12/2011  . Anxiety 04/24/2011  . Reflux 04/18/2012  . Insomnia 04/18/2012  . Bronchitis 07/24/2012  . Asthma with acute exacerbation 08/19/2008    Qualifier: Diagnosis of  By: Andrey Campanile MD, Raliegh Ip     . RUQ pain 09/20/2012   Past Surgical History  Procedure Laterality Date  . Ankle surgery Right 02/2010    removal of tissue and reconstruction  . Tonsillectomy  1996  . Liver surgery     Family History  Problem Relation Age of Onset  . Hypertension Mother   . Hyperlipidemia Father   . Hypertension Father   . Dementia Maternal Grandmother   . Diabetes Paternal Grandmother   . Depression Paternal Grandfather     suicide  . Heart attack Maternal Aunt   . Colon cancer Neg Hx   . Esophageal cancer Neg Hx   . Stomach cancer Neg Hx   . Rectal cancer Neg Hx    History  Substance Use Topics  . Smoking status: Never Smoker   .  Smokeless tobacco: Never Used  . Alcohol Use: Yes     Comment: occasionally   OB History   Grav Para Term Preterm Abortions TAB SAB Ect Mult Living                 Review of Systems  All other systems reviewed and are negative.      Allergies  Maxalt mlt and Latex  Home Medications   Current Outpatient Rx  Name  Route  Sig  Dispense  Refill  . albuterol (PROVENTIL HFA;VENTOLIN HFA) 108 (90 BASE) MCG/ACT inhaler   Inhalation   Inhale 2 puffs into the lungs every 6 (six) hours as needed for wheezing or shortness of breath.         . ALPRAZolam (XANAX) 0.5 MG tablet   Oral   Take 0.5 mg by mouth at bedtime as needed for anxiety or sleep.         . Artificial Tear Ointment (ARTIFICIAL TEARS) ointment   Both Eyes   Place 1 drop into both eyes 3 (three) times daily as needed (dry eyes).          . furosemide (LASIX) 20 MG tablet  Oral   Take 1 tablet (20 mg total) by mouth daily as needed for fluid or edema.   30 tablet   3   . Hydrocodone-Acetaminophen 7.5-300 MG TABS   Oral   Take 1-2 tablets by mouth every 4 (four) hours as needed (pain).         . methocarbamol (ROBAXIN) 500 MG tablet   Oral   Take 500 mg by mouth every 8 (eight) hours as needed for muscle spasms.         . metoCLOPramide (REGLAN) 5 MG tablet   Oral   Take 5 mg by mouth 4 (four) times daily.         . Multiple Vitamin (MULTIVITAMIN) tablet   Oral   Take 1 tablet by mouth daily.           . ondansetron (ZOFRAN) 8 MG tablet   Oral   Take 8 mg by mouth every 8 (eight) hours as needed for nausea or vomiting.         . pantoprazole (PROTONIX) 40 MG tablet   Oral   Take 40 mg by mouth daily.         . polyethylene glycol (MIRALAX / GLYCOLAX) packet   Oral   Take 17 g by mouth 2 (two) times daily.         . Probiotic Product (PROBIOTIC DAILY PO)   Oral   Take 1 tablet by mouth daily. Digestive Advantage probiotic gummies by [employer].  Pt sometimes takes 2 at one time          . scopolamine (TRANSDERM-SCOP) 1.5 MG   Transdermal   Place 1 patch onto the skin every 3 (three) days.         Marland Kitchen. senna-docusate (SENOKOT-S) 8.6-50 MG per tablet   Oral   Take 2 tablets by mouth 2 (two) times daily as needed for mild constipation.         . simethicone (MYLICON) 80 MG chewable tablet   Oral   Chew 80 mg by mouth every 6 (six) hours as needed for flatulence.          BP 150/88  Pulse 90  Temp(Src) 98.1 F (36.7 C) (Oral)  Resp 21  SpO2 98%  LMP 10/06/2013 Physical Exam  Nursing note and vitals reviewed. Constitutional: She appears distressed.  HENT:  Head: Normocephalic and atraumatic.  Right Ear: External ear normal.  Left Ear: External ear normal.  Eyes: Conjunctivae are normal. Right eye exhibits no discharge. Left eye exhibits no discharge. No scleral icterus.  Neck: Neck supple. No tracheal deviation present.  Cardiovascular: Normal rate, regular rhythm and intact distal pulses.   Pulmonary/Chest: Effort normal and breath sounds normal. No stridor. No respiratory distress. She has no wheezes. She has no rales.  Abdominal: Soft. Bowel sounds are normal. She exhibits no distension. There is generalized tenderness. There is guarding. There is no rebound. Hernia confirmed negative in the ventral area.  laparoscopy scars without e/e  Musculoskeletal: She exhibits no edema and no tenderness.  Neurological: She is alert. She has normal strength. No cranial nerve deficit (no facial droop, extraocular movements intact, no slurred speech) or sensory deficit. She exhibits normal muscle tone. She displays no seizure activity. Coordination normal.  Skin: Skin is warm and dry. No rash noted. She is not diaphoretic.  Psychiatric: She has a normal mood and affect.    ED Course  Procedures (including critical care time) Labs Review Labs Reviewed  CBC WITH DIFFERENTIAL -  Abnormal; Notable for the following:    WBC 15.8 (*)    Neutro Abs 11.9 (*)     Monocytes Absolute 1.5 (*)    All other components within normal limits  COMPREHENSIVE METABOLIC PANEL - Abnormal; Notable for the following:    Glucose, Bld 119 (*)    Albumin 3.4 (*)    ALT 45 (*)    All other components within normal limits  URINALYSIS, ROUTINE W REFLEX MICROSCOPIC - Abnormal; Notable for the following:    Color, Urine AMBER (*)    APPearance CLOUDY (*)    Specific Gravity, Urine 1.043 (*)    Hgb urine dipstick TRACE (*)    Bilirubin Urine SMALL (*)    Protein, ur 30 (*)    All other components within normal limits  URINE MICROSCOPIC-ADD ON - Abnormal; Notable for the following:    Bacteria, UA FEW (*)    Crystals CA OXALATE CRYSTALS (*)    All other components within normal limits  LIPASE, BLOOD  HCG, SERUM, QUALITATIVE   Imaging Review Ct Angio Chest W/cm &/or Wo Cm  10/12/2013   CLINICAL DATA:  Recent liver surgery (10/05/2013). Tachycardia and chest pain.  EXAM: CT ANGIOGRAPHY CHEST  CT ABDOMEN AND PELVIS WITH CONTRAST  TECHNIQUE: Multidetector CT imaging of the chest was performed using the standard protocol during bolus administration of intravenous contrast. Multiplanar CT image reconstructions and MIPs were obtained to evaluate the vascular anatomy. Multidetector CT imaging of the abdomen and pelvis was performed using the standard protocol during bolus administration of intravenous contrast.  CONTRAST:  OMNIPAQUE IOHEXOL 350 MG/ML SOLN  COMPARISON:  Abdominal CT 09/22/2012  FINDINGS: CTA CHEST FINDINGS  THORACIC INLET/BODY WALL:  No acute abnormality.  MEDIASTINUM:  Generous heart size. No pericardial effusion. No evidence of pulmonary embolism or acute systemic arterial abnormality. No lymphadenopathy.  LUNG WINDOWS:  Small bilateral pleural effusions, left more than right, which appears simple and layering. There is left more than right basilar atelectasis. No definitive pneumonia. No pneumothorax. Calcified granuloma in the right upper lobe.  OSSEOUS:  No  acute fracture.  No suspicious lytic or blastic lesions.  CT ABDOMEN and PELVIS FINDINGS  ABDOMEN/PELVIS:  Liver: Surgical changes of wedge resection or segmentectomy of segments 2 and 3. There is no loculated collection along the surgical margin. No evidence of hematoma. There is a small volume of perihepatic ascites which is nonspecific. The left portal vein does not enhance. The hepatic veins still opacified. T  Biliary: No evidence of biliary ductal dilation. There is mild thickening of the gallbladder wall which is likely reactive.  Pancreas: Unremarkable.  Spleen: Unremarkable.  Adrenals: Unremarkable.  Kidneys and ureters: No hydronephrosis or stone.  Bladder: Unremarkable.  Reproductive: Unremarkable.  Bowel: No obstruction. Normal appendix.  Retroperitoneum: No mass or adenopathy.  Peritoneum: Small volume pneumoperitoneum ventrally. There is likely extraperitoneal gas related to laparoscopic access. Small volume peritoneal fluid which appears simple in density. Calcification or hemostatic material is noted in the left peritoneum near the spleen.  Vascular: Left portal venous occlusion as above. Hepatic artery appears patent.  OSSEOUS: No acute abnormalities.  Review of the MIP images confirms the above findings.  IMPRESSION: 1. Negative for pulmonary embolism. 2. Bibasilar atelectasis with small pleural effusions. 3. Left portal vein thrombosis. 4. No hematoma or significant collection status post left hepatic mass resection. 5. Postsurgical changes, including pneumoperitoneum, described above.   Electronically Signed   By: Tiburcio Pea M.D.   On: 10/12/2013  03:19   Ct Abdomen Pelvis W Contrast  10/12/2013   CLINICAL DATA:  Recent liver surgery (10/05/2013). Tachycardia and chest pain.  EXAM: CT ANGIOGRAPHY CHEST  CT ABDOMEN AND PELVIS WITH CONTRAST  TECHNIQUE: Multidetector CT imaging of the chest was performed using the standard protocol during bolus administration of intravenous contrast.  Multiplanar CT image reconstructions and MIPs were obtained to evaluate the vascular anatomy. Multidetector CT imaging of the abdomen and pelvis was performed using the standard protocol during bolus administration of intravenous contrast.  CONTRAST:  OMNIPAQUE IOHEXOL 350 MG/ML SOLN  COMPARISON:  Abdominal CT 09/22/2012  FINDINGS: CTA CHEST FINDINGS  THORACIC INLET/BODY WALL:  No acute abnormality.  MEDIASTINUM:  Generous heart size. No pericardial effusion. No evidence of pulmonary embolism or acute systemic arterial abnormality. No lymphadenopathy.  LUNG WINDOWS:  Small bilateral pleural effusions, left more than right, which appears simple and layering. There is left more than right basilar atelectasis. No definitive pneumonia. No pneumothorax. Calcified granuloma in the right upper lobe.  OSSEOUS:  No acute fracture.  No suspicious lytic or blastic lesions.  CT ABDOMEN and PELVIS FINDINGS  ABDOMEN/PELVIS:  Liver: Surgical changes of wedge resection or segmentectomy of segments 2 and 3. There is no loculated collection along the surgical margin. No evidence of hematoma. There is a small volume of perihepatic ascites which is nonspecific. The left portal vein does not enhance. The hepatic veins still opacified. T  Biliary: No evidence of biliary ductal dilation. There is mild thickening of the gallbladder wall which is likely reactive.  Pancreas: Unremarkable.  Spleen: Unremarkable.  Adrenals: Unremarkable.  Kidneys and ureters: No hydronephrosis or stone.  Bladder: Unremarkable.  Reproductive: Unremarkable.  Bowel: No obstruction. Normal appendix.  Retroperitoneum: No mass or adenopathy.  Peritoneum: Small volume pneumoperitoneum ventrally. There is likely extraperitoneal gas related to laparoscopic access. Small volume peritoneal fluid which appears simple in density. Calcification or hemostatic material is noted in the left peritoneum near the spleen.  Vascular: Left portal venous occlusion as above.  Hepatic artery appears patent.  OSSEOUS: No acute abnormalities.  Review of the MIP images confirms the above findings.  IMPRESSION: 1. Negative for pulmonary embolism. 2. Bibasilar atelectasis with small pleural effusions. 3. Left portal vein thrombosis. 4. No hematoma or significant collection status post left hepatic mass resection. 5. Postsurgical changes, including pneumoperitoneum, described above.   Electronically Signed   By: Tiburcio Pea M.D.   On: 10/12/2013 03:19     EKG Interpretation   Date/Time:  Monday October 12 2013 00:15:44 EST Ventricular Rate:  103 PR Interval:  127 QRS Duration: 96 QT Interval:  343 QTC Calculation: 449 R Axis:   89 Text Interpretation:  Sinus tachycardia Atrial premature complex Left  atrial enlargement Abnormal T, consider ischemia, diffuse leads No old  tracing to compare Confirmed by Emilio Baylock  MD-J, Walker Sitar ([id number]) on 10/12/2013  3:35:37 AM     Medications  0.9 %  sodium chloride infusion (0 mLs Intravenous Stopped 10/12/13 0142)    Followed by  0.9 %  sodium chloride infusion (0 mLs Intravenous Stopped 10/12/13 0357)  HYDROmorphone (DILAUDID) injection 1 mg (1 mg Intravenous Given 10/12/13 0146)  ondansetron (ZOFRAN) injection 4 mg (4 mg Intravenous Given 10/12/13 0037)  iohexol (OMNIPAQUE) 300 MG/ML solution 50 mL (50 mLs Oral Contrast Given 10/12/13 0101)  iohexol (OMNIPAQUE) 350 MG/ML injection 100 mL (100 mLs Intravenous Contrast Given 10/12/13 0243)    MDM   Final diagnoses:  Abdominal pain  Thrombosis, portal  vein    CT does not show PE.  No sign of free fluid or hemorrhage on abdominal CT.  Left portal vein thrombosis noted on CT scan.  Discussed the CT findings , with the [facility] , Dr Anthonette Legato.  Does not recommend any treatment at this time.  They asked patient to bring the CT scan with her to [facility] so they can review the findings and decide whether she will need any anticoagulation.  Pt will follow up with her surgeon.  Informed patient  and family of the findings.  Comfortable with discharge.    Celene Kras, MD 10/12/13 1610  Celene Kras, MD 10/12/13 [phone]

## 2013-10-12 NOTE — ED Notes (Signed)
Pt presents with c/o tachycardia and shortness of breath. [facility] and yesterday around 2:15 she started to notice an elevated heart and some shortness of breath. Pt says she experiences pain with deep breathing. Pt says she checked her heart rate at home and it was 140-160 and O2 sats dropped to 85 after she went up the stairs.

## 2013-10-12 NOTE — Discharge Instructions (Signed)
Abdominal Pain, Adult °Many things can cause abdominal pain. Usually, abdominal pain is not caused by a disease and will improve without treatment. It can often be observed and treated at home. Your health care provider will do a physical exam and possibly order blood tests and X-rays to help determine the seriousness of your pain. However, in many cases, more time must pass before a clear cause of the pain can be found. Before that point, your health care provider may not know if you need more testing or further treatment. °HOME CARE INSTRUCTIONS  °Monitor your abdominal pain for any changes. The following actions may help to alleviate any discomfort you are experiencing: °· Only take over-the-counter or prescription medicines as directed by your health care provider. °· Do not take laxatives unless directed to do so by your health care provider. °· Try a clear liquid diet (broth, tea, or water) as directed by your health care provider. Slowly move to a bland diet as tolerated. °SEEK MEDICAL CARE IF: °· You have unexplained abdominal pain. °· You have abdominal pain associated with nausea or diarrhea. °· You have pain when you urinate or have a bowel movement. °· You experience abdominal pain that wakes you in the night. °· You have abdominal pain that is worsened or improved by eating food. °· You have abdominal pain that is worsened with eating fatty foods. °SEEK IMMEDIATE MEDICAL CARE IF:  °· Your pain does not go away within 2 hours. °· You have a fever. °· You keep throwing up (vomiting). °· Your pain is felt only in portions of the abdomen, such as the right side or the left lower portion of the abdomen. °· You pass bloody or black tarry stools. °MAKE SURE YOU: °· Understand these instructions.   °· Will watch your condition.   °· Will get help right away if you are not doing well or get worse.   °Document Released: 05/09/2005 Document Revised: 05/20/2013 Document Reviewed: 04/08/2013 °ExitCare® Patient  Information ©2014 [employer]. ° °

## 2013-10-13 ENCOUNTER — Telehealth: Payer: Self-pay | Admitting: Internal Medicine

## 2013-10-13 ENCOUNTER — Telehealth: Payer: Self-pay | Admitting: Family Medicine

## 2013-10-13 NOTE — Telephone Encounter (Signed)
Please call patient and [facility]. I can set up if she agrees

## 2013-10-13 NOTE — Telephone Encounter (Signed)
Called by liver transplant fellow regarding patient's discharge from [facility] which is planned for later today Status post left lateral hepatectomy with post operative left portal vein thrombosis. She is on Lovenox bridging to Coumadin They have recommended anticoagulation for 3 months, and a CT scan of the abdomen and pelvis with contrast to evaluate for residual portal vein clot in 3 months to ensure clot resolution She needs to set up with anticoagulation clinic and this will likely be done through Dr. Abner GreenspanBlyth, her PCP They recommend INR rechecked on Thursday  She has some postsurgical wound soreness but preoperative pain has resolved.  Dr. Noland Fordycehaubal expects complete recovery

## 2013-10-13 NOTE — Telephone Encounter (Signed)
She will get her first dose of coumadin tonight and the transplant team wants her to have pt inr on Thursday.  They would like to be advised that this has been scheduled.  Cell [phone].  [facility]

## 2013-10-13 NOTE — Telephone Encounter (Signed)
Pt would like to proceed with the coumadin clinic at [facility]. Pt states she has all the RX's and everything. Please set up

## 2013-10-13 NOTE — Telephone Encounter (Signed)
I will set her up with Coumadin clinic tomorrow morning. Thanks for passing this on.

## 2013-10-14 ENCOUNTER — Other Ambulatory Visit: Payer: Self-pay | Admitting: Family Medicine

## 2013-10-14 DIAGNOSIS — I81 Portal vein thrombosis: Secondary | ICD-10-CM

## 2013-10-14 HISTORY — DX: Portal vein thrombosis: I81

## 2013-10-14 NOTE — Telephone Encounter (Signed)
Please let patient know I set up referral and then call the coumadin clinic and let them know we would like a check as soon as possible. She had a [facility] and needs to be monitored she is a Engineer, civil (consulting)nurse at [employer]. Have coumadin call me at home if they have any questions. Thanks

## 2013-10-14 NOTE — Telephone Encounter (Signed)
Scheduled appt for Friday at 10:30am [facility]. Notified pt.

## 2013-10-14 NOTE — Telephone Encounter (Signed)
thanks

## 2013-10-20 ENCOUNTER — Telehealth: Payer: Self-pay | Admitting: General Practice

## 2013-10-20 NOTE — Telephone Encounter (Signed)
LMOVM

## 2013-10-23 ENCOUNTER — Telehealth: Payer: Self-pay

## 2013-10-23 NOTE — Telephone Encounter (Signed)
I called and left a message on patients vm and answering machine to return our call. Dr Abner GreenspanBlyth also has called both phones and left messages for patient to return our call. We need to know if pt is getting her PT/INR checked

## 2013-10-27 NOTE — Telephone Encounter (Signed)
I called and left another message. I will send a letter

## 2013-10-30 ENCOUNTER — Ambulatory Visit (INDEPENDENT_AMBULATORY_CARE_PROVIDER_SITE_OTHER): Payer: BC Managed Care – PPO | Admitting: General Practice

## 2014-03-24 ENCOUNTER — Other Ambulatory Visit (HOSPITAL_COMMUNITY): Payer: Self-pay | Admitting: Family Medicine

## 2014-03-24 ENCOUNTER — Ambulatory Visit (HOSPITAL_COMMUNITY)
Admission: RE | Admit: 2014-03-24 | Discharge: 2014-03-24 | Disposition: A | Discharge status: Home / Self Care | Payer: BC Managed Care – PPO | Source: Ambulatory Visit | Attending: Family Medicine | Admitting: Family Medicine

## 2014-03-24 DIAGNOSIS — R1011 Right upper quadrant pain: Secondary | ICD-10-CM

## 2014-03-24 MED ORDER — IOHEXOL 300 MG/ML  SOLN
100.0000 mL | Freq: Once | INTRAMUSCULAR | Status: AC | PRN
  Administered 2014-03-24: 100 mL via INTRAVENOUS

## 2014-03-24 MED ORDER — IOHEXOL 300 MG/ML  SOLN: 25.0000 mL | Freq: Once | INTRAMUSCULAR | Status: AC | PRN

## 2014-03-25 ENCOUNTER — Ambulatory Visit (HOSPITAL_COMMUNITY): Payer: BC Managed Care – PPO

## 2014-07-09 ENCOUNTER — Encounter (HOSPITAL_COMMUNITY): Payer: Self-pay

## 2014-07-09 ENCOUNTER — Emergency Department (HOSPITAL_COMMUNITY)
Admission: EM | Admit: 2014-07-09 | Discharge: 2014-07-10 | Disposition: A | Discharge status: Home / Self Care | Payer: BC Managed Care – PPO | Attending: Emergency Medicine | Admitting: Emergency Medicine

## 2014-07-09 ENCOUNTER — Emergency Department (HOSPITAL_COMMUNITY): Payer: BC Managed Care – PPO

## 2014-07-09 DIAGNOSIS — R1013 Epigastric pain: Secondary | ICD-10-CM | POA: Diagnosis not present

## 2014-07-09 DIAGNOSIS — Z8669 Personal history of other diseases of the nervous system and sense organs: Secondary | ICD-10-CM | POA: Diagnosis not present

## 2014-07-09 DIAGNOSIS — Z8719 Personal history of other diseases of the digestive system: Secondary | ICD-10-CM | POA: Insufficient documentation

## 2014-07-09 DIAGNOSIS — Z79899 Other long term (current) drug therapy: Secondary | ICD-10-CM | POA: Diagnosis not present

## 2014-07-09 DIAGNOSIS — R109 Unspecified abdominal pain: Secondary | ICD-10-CM

## 2014-07-09 DIAGNOSIS — J45909 Unspecified asthma, uncomplicated: Secondary | ICD-10-CM | POA: Insufficient documentation

## 2014-07-09 DIAGNOSIS — Z9889 Other specified postprocedural states: Secondary | ICD-10-CM | POA: Diagnosis not present

## 2014-07-09 DIAGNOSIS — R1011 Right upper quadrant pain: Secondary | ICD-10-CM | POA: Insufficient documentation

## 2014-07-09 DIAGNOSIS — Z91040 Latex allergy status: Secondary | ICD-10-CM | POA: Insufficient documentation

## 2014-07-09 DIAGNOSIS — E663 Overweight: Secondary | ICD-10-CM | POA: Diagnosis not present

## 2014-07-09 DIAGNOSIS — R110 Nausea: Secondary | ICD-10-CM | POA: Diagnosis not present

## 2014-07-09 DIAGNOSIS — Z8619 Personal history of other infectious and parasitic diseases: Secondary | ICD-10-CM | POA: Diagnosis not present

## 2014-07-09 DIAGNOSIS — F419 Anxiety disorder, unspecified: Secondary | ICD-10-CM | POA: Diagnosis not present

## 2014-07-09 DIAGNOSIS — R011 Cardiac murmur, unspecified: Secondary | ICD-10-CM | POA: Diagnosis not present

## 2014-07-09 LAB — COMPREHENSIVE METABOLIC PANEL
ALT: 12 U/L (ref 0–35)
AST: 20 U/L (ref 0–37)
Albumin: 4.2 g/dL (ref 3.5–5.2)
Alkaline Phosphatase: 82 U/L (ref 39–117)
Anion gap: 14 (ref 5–15)
BUN: 16 mg/dL (ref 6–23)
CO2: 24 mEq/L (ref 19–32)
Calcium: 9.5 mg/dL (ref 8.4–10.5)
Chloride: 100 mEq/L (ref 96–112)
Creatinine, Ser: 0.86 mg/dL (ref 0.50–1.10)
GFR calc Af Amer: >90 mL/min (ref >90)
GFR calc non Af Amer: 86 mL/min — ABNORMAL LOW (ref >90)
Glucose, Bld: 85 mg/dL (ref 70–99)
Potassium: 3.9 mEq/L (ref 3.7–5.3)
Sodium: 138 mEq/L (ref 137–147)
Total Bilirubin: 0.5 mg/dL (ref 0.3–1.2)
Total Protein: 7.6 g/dL (ref 6.0–8.3)

## 2014-07-09 LAB — CBC WITH DIFFERENTIAL/PLATELET
Basophils Absolute: 0 10*3/uL (ref 0.0–0.1)
Basophils Relative: 0 % (ref 0–1)
Eosinophils Absolute: 0 10*3/uL (ref 0.0–0.7)
Eosinophils Relative: 0 % (ref 0–5)
HCT: 42.4 % (ref 36.0–46.0)
Hemoglobin: 14.2 g/dL (ref 12.0–15.0)
Lymphocytes Relative: 23 % (ref 12–46)
Lymphs Abs: 2.3 10*3/uL (ref 0.7–4.0)
MCH: 31.6 pg (ref 26.0–34.0)
MCHC: 33.5 g/dL (ref 30.0–36.0)
MCV: 94.4 fL (ref 78.0–100.0)
Monocytes Absolute: 0.5 10*3/uL (ref 0.1–1.0)
Monocytes Relative: 6 % (ref 3–12)
Neutro Abs: 7.1 10*3/uL (ref 1.7–7.7)
Neutrophils Relative %: 71 % (ref 43–77)
Platelets: 241 10*3/uL (ref 150–400)
RBC: 4.49 MIL/uL (ref 3.87–5.11)
RDW: 12.2 % (ref 11.5–15.5)
WBC: 9.9 10*3/uL (ref 4.0–10.5)

## 2014-07-09 LAB — URINALYSIS, ROUTINE W REFLEX MICROSCOPIC
Bilirubin Urine: NEGATIVE
Glucose, UA: NEGATIVE mg/dL
Hgb urine dipstick: NEGATIVE
Ketones, ur: NEGATIVE mg/dL
Leukocytes, UA: NEGATIVE
Nitrite: NEGATIVE
Protein, ur: NEGATIVE mg/dL
Specific Gravity, Urine: 1.023 (ref 1.005–1.030)
Urobilinogen, UA: 0.2 mg/dL (ref 0.0–1.0)
pH: 5 (ref 5.0–8.0)

## 2014-07-09 LAB — LIPASE, BLOOD: Lipase: 20 U/L (ref 11–59)

## 2014-07-09 MED ORDER — IOHEXOL 300 MG/ML  SOLN
100.0000 mL | Freq: Once | INTRAMUSCULAR | Status: AC | PRN
  Administered 2014-07-09: 100 mL via INTRAVENOUS

## 2014-07-09 MED ORDER — ONDANSETRON HCL 4 MG/2ML IJ SOLN
4.0000 mg | Freq: Once | INTRAMUSCULAR | Status: AC
  Administered 2014-07-09: 4 mg via INTRAVENOUS
  Filled 2014-07-09: qty 2

## 2014-07-09 MED ORDER — SODIUM CHLORIDE 0.9 % IV BOLUS (SEPSIS)
1000.0000 mL | Freq: Once | INTRAVENOUS | Status: AC
  Administered 2014-07-09: 1000 mL via INTRAVENOUS

## 2014-07-09 MED ORDER — TRAMADOL HCL 50 MG PO TABS: 50.0000 mg | ORAL_TABLET | Freq: Four times a day (QID) | ORAL | Status: DC | PRN

## 2014-07-09 MED ORDER — HYDROMORPHONE HCL 1 MG/ML IJ SOLN
1.0000 mg | Freq: Once | INTRAMUSCULAR | Status: AC
  Administered 2014-07-09: 1 mg via INTRAVENOUS
  Filled 2014-07-09: qty 1

## 2014-07-09 NOTE — ED Notes (Signed)
Bed: WA03 Expected date:  Expected time:  Means of arrival:  Comments: Lorn JunesMerritt

## 2014-07-09 NOTE — ED Notes (Signed)
Pt having RUQ pain since last week with fever / chills.  Pain is worse today.  Part of liver removed in February for hyperplasia.  2 Tumors.  Nausea with no vomiting/diarrhea.  No change in urination.

## 2014-07-09 NOTE — ED Notes (Signed)
Ultrasound at bedside

## 2014-07-09 NOTE — Discharge Instructions (Signed)
Abdominal Pain, Women °Abdominal (stomach, pelvic, or belly) pain can be caused by many things. It is important to tell your doctor: °· The location of the pain. °· Does it come and go or is it present all the time? °· Are there things that start the pain (eating certain foods, exercise)? °· Are there other symptoms associated with the pain (fever, nausea, vomiting, diarrhea)? °All of this is helpful to know when trying to find the cause of the pain. °CAUSES  °· Stomach: virus or bacteria infection, or ulcer. °· Intestine: appendicitis (inflamed appendix), regional ileitis (Crohn's disease), ulcerative colitis (inflamed colon), irritable bowel syndrome, diverticulitis (inflamed diverticulum of the colon), or cancer of the stomach or intestine. °· Gallbladder disease or stones in the gallbladder. °· Kidney disease, kidney stones, or infection. °· Pancreas infection or cancer. °· Fibromyalgia (pain disorder). °· Diseases of the female organs: °¨ Uterus: fibroid (non-cancerous) tumors or infection. °¨ Fallopian tubes: infection or tubal pregnancy. °¨ Ovary: cysts or tumors. °¨ Pelvic adhesions (scar tissue). °¨ Endometriosis (uterus lining tissue growing in the pelvis and on the pelvic organs). °¨ Pelvic congestion syndrome (female organs filling up with blood just before the menstrual period). °¨ Pain with the menstrual period. °¨ Pain with ovulation (producing an egg). °¨ Pain with an IUD (intrauterine device, birth control) in the uterus. °¨ Cancer of the female organs. °· Functional pain (pain not caused by a disease, may improve without treatment). °· Psychological pain. °· Depression. °DIAGNOSIS  °Your doctor will decide the seriousness of your pain by doing an examination. °· Blood tests. °· X-rays. °· Ultrasound. °· CT scan (computed tomography, special type of X-ray). °· MRI (magnetic resonance imaging). °· Cultures, for infection. °· Barium enema (dye inserted in the large intestine, to better view it with  X-rays). °· Colonoscopy (looking in intestine with a lighted tube). °· Laparoscopy (minor surgery, looking in abdomen with a lighted tube). °· Major abdominal exploratory surgery (looking in abdomen with a large incision). °TREATMENT  °The treatment will depend on the cause of the pain.  °· Many cases can be observed and treated at home. °· Over-the-counter medicines recommended by your caregiver. °· Prescription medicine. °· Antibiotics, for infection. °· Birth control pills, for painful periods or for ovulation pain. °· Hormone treatment, for endometriosis. °· Nerve blocking injections. °· Physical therapy. °· Antidepressants. °· Counseling with a psychologist or psychiatrist. °· Minor or major surgery. °HOME CARE INSTRUCTIONS  °· Do not take laxatives, unless directed by your caregiver. °· Take over-the-counter pain medicine only if ordered by your caregiver. Do not take aspirin because it can cause an upset stomach or bleeding. °· Try a clear liquid diet (broth or water) as ordered by your caregiver. Slowly move to a bland diet, as tolerated, if the pain is related to the stomach or intestine. °· Have a thermometer and take your temperature several times a day, and record it. °· Bed rest and sleep, if it helps the pain. °· Avoid sexual intercourse, if it causes pain. °· Avoid stressful situations. °· Keep your follow-up appointments and tests, as your caregiver orders. °· If the pain does not go away with medicine or surgery, you may try: °¨ Acupuncture. °¨ Relaxation exercises (yoga, meditation). °¨ Group therapy. °¨ Counseling. °SEEK MEDICAL CARE IF:  °· You notice certain foods cause stomach pain. °· Your home care treatment is not helping your pain. °· You need stronger pain medicine. °· You want your IUD removed. °· You feel faint or   lightheaded. °· You develop nausea and vomiting. °· You develop a rash. °· You are having side effects or an allergy to your medicine. °SEEK IMMEDIATE MEDICAL CARE IF:  °· Your  pain does not go away or gets worse. °· You have a fever. °· Your pain is felt only in portions of the abdomen. The right side could possibly be appendicitis. The left lower portion of the abdomen could be colitis or diverticulitis. °· You are passing blood in your stools (bright red or black tarry stools, with or without vomiting). °· You have blood in your urine. °· You develop chills, with or without a fever. °· You pass out. °MAKE SURE YOU:  °· Understand these instructions. °· Will watch your condition. °· Will get help right away if you are not doing well or get worse. °Document Released: 05/27/2007 Document Revised: 12/14/2013 Document Reviewed: 06/16/2009 °ExitCare® Patient Information ©2015 [employer]. This information is not intended to replace advice given to you by your health care provider. Make sure you discuss any questions you have with your health care provider. ° °

## 2014-07-10 MED ORDER — HYDROCODONE-ACETAMINOPHEN 5-325 MG PO TABS: 1.0000 | ORAL_TABLET | ORAL | Status: DC | PRN

## 2014-07-14 NOTE — ED Provider Notes (Signed)
CSN: [id number]     Arrival date & time 07/09/14  1422 History   First MD Initiated Contact with Patient 07/09/14 1622     Chief Complaint  Patient presents with  . Abdominal Pain     (Consider location/radiation/quality/duration/timing/severity/associated sxs/prior Treatment) Patient is a 38 y.o. female presenting with abdominal pain. The history is provided by the patient and medical records. No language interpreter was used.  Abdominal Pain Pain location:  RUQ Pain quality: sharp and stabbing   Pain radiates to:  Does not radiate Onset quality:  Gradual Duration:  1 week Context: previous surgery   Context: not alcohol use and not sick contacts   Worsened by:  Position changes Associated symptoms: nausea   Associated symptoms: no chest pain, no cough, no dysuria, no fever, no hematuria and no vomiting   Risk factors: no alcohol abuse and not pregnant     Past Medical History  Diagnosis Date  . Epilepsy     medically cleared and not followed by a physician for 16 yrs  . Fifth disease infant  . Chicken pox 7 yrs old  . Anxiety   . Hyperlipidemia 04/24/2011  . Hx of tympanostomy tubes   . Overweight(278.02) 05/21/2011  . Newly recognized heart murmur 09/12/2011  . Functional dyspepsia 09/12/2011  . Anxiety 04/24/2011  . Reflux 04/18/2012  . Insomnia 04/18/2012  . Bronchitis 07/24/2012  . Asthma with acute exacerbation 08/19/2008    Qualifier: Diagnosis of  By: Andrey CampanileWilson MD, Raliegh IpLauraLee     . RUQ pain 09/20/2012   Past Surgical History  Procedure Laterality Date  . Ankle surgery Right 02/2010    removal of tissue and reconstruction  . Tonsillectomy  1996  . Liver surgery     Family History  Problem Relation Age of Onset  . Hypertension Mother   . Hyperlipidemia Father   . Hypertension Father   . Dementia Maternal Grandmother   . Diabetes Paternal Grandmother   . Depression Paternal Grandfather     suicide  . Heart attack Maternal Aunt   . Colon cancer Neg Hx   . Esophageal  cancer Neg Hx   . Stomach cancer Neg Hx   . Rectal cancer Neg Hx    History  Substance Use Topics  . Smoking status: Never Smoker   . Smokeless tobacco: Never Used  . Alcohol Use: Yes     Comment: occasionally   OB History    No data available     Review of Systems  Constitutional: Negative for fever.  Respiratory: Negative for cough.   Cardiovascular: Negative for chest pain.  Gastrointestinal: Positive for nausea and abdominal pain. Negative for vomiting.  Genitourinary: Negative for dysuria and hematuria.    All systems reviewed and negative, other than as noted in HPI.   Allergies  Maxalt mlt and Latex  Home Medications   Prior to Admission medications   Medication Sig Start Date End Date Taking? Authorizing Provider  albuterol (PROVENTIL HFA;VENTOLIN HFA) 108 (90 BASE) MCG/ACT inhaler Inhale 2 puffs into the lungs every 6 (six) hours as needed for wheezing or shortness of breath.   Yes Historical Provider, MD  ALPRAZolam Prudy Feeler(XANAX) 0.5 MG tablet Take 0.5 mg by mouth at bedtime as needed for anxiety or sleep.   Yes Historical Provider, MD  Artificial Tear Ointment (ARTIFICIAL TEARS) ointment Place 1 drop into both eyes 3 (three) times daily as needed (dry eyes).    Yes Historical Provider, MD  methocarbamol (ROBAXIN) 500 MG tablet Take  500 mg by mouth every 8 (eight) hours as needed for muscle spasms.   Yes Historical Provider, MD  Multiple Vitamin (MULTIVITAMIN) tablet Take 1 tablet by mouth daily.     Yes Historical Provider, MD  Probiotic Product (PROBIOTIC DAILY PO) Take 1 tablet by mouth daily. Digestive Advantage probiotic gummies by [employer].  Pt sometimes takes 2 at one time   Yes Historical Provider, MD  furosemide (LASIX) 20 MG tablet Take 1 tablet (20 mg total) by mouth daily as needed for fluid or edema. Patient not taking: Reported on 07/09/2014 06/26/13   Bradd CanaryStacey A Blyth, MD  HYDROcodone-acetaminophen (NORCO/VICODIN) 5-325 MG per tablet Take 1-2 tablets by mouth  every 4 (four) hours as needed for moderate pain or severe pain. 07/10/14   Raeford RazorStephen Chaquita Basques, MD  traMADol (ULTRAM) 50 MG tablet Take 1 tablet (50 mg total) by mouth every 6 (six) hours as needed. 07/09/14   Raeford RazorStephen Henley Boettner, MD   BP 122/77 mmHg  Pulse 73  Temp(Src) 98.2 F (36.8 C) (Oral)  Resp 18  Ht 5\' 3"  (1.6 m)  Wt 205 lb (92.987 kg)  BMI 36.32 kg/m2  SpO2 95%  LMP 06/28/2014 Physical Exam  Constitutional: She appears well-developed and well-nourished. No distress.  HENT:  Head: Normocephalic and atraumatic.  Eyes: Conjunctivae are normal. Right eye exhibits no discharge. Left eye exhibits no discharge.  Neck: Neck supple.  Cardiovascular: Normal rate, regular rhythm and normal heart sounds.  Exam reveals no gallop and no friction rub.   No murmur heard. Pulmonary/Chest: Effort normal and breath sounds normal. No respiratory distress.  Abdominal: Soft. She exhibits no distension. There is tenderness. There is no rebound and no guarding.  Tenderness in epigastrium and right upper quadrant with voluntary guarding. No rebound or distention. Surgical sites appear to be well-healed.  Genitourinary:  No CVA tenderness  Musculoskeletal: She exhibits no edema or tenderness.  Neurological: She is alert.  Skin: Skin is warm and dry.  Psychiatric: She has a normal mood and affect. Her behavior is normal. Thought content normal.  Nursing note and vitals reviewed.   ED Course  Procedures (including critical care time) Labs Review Labs Reviewed  COMPREHENSIVE METABOLIC PANEL - Abnormal; Notable for the following:    GFR calc non Af Amer 86 (*)    All other components within normal limits  CBC WITH DIFFERENTIAL  URINALYSIS, ROUTINE W REFLEX MICROSCOPIC  LIPASE, BLOOD    Imaging Review No results found.   EKG Interpretation None      MDM   Final diagnoses:  Abdominal pain  RUQ pain    [age] y female with right upper quadrant pain. History of partial hepatic resection. Imaging  today distention acute abnormality. Labs showed unremarkable as well. Symptoms have been improved. Does not appear to be emergent process. Plan continue symptomatic treatment. Return precautions discussed.    Raeford RazorStephen Alia Parsley, MD 07/14/14 2119

## 2014-11-08 IMAGING — CT CT ABD-PELV W/ CM
1 of 2 series · 15 of 32 positions shown, 19 images · IV contrast (OMNIPAQUE 300)
Comparison: MRI to [DATE].  Ultrasound 09/19/2012.

CLINICAL DATA: Abdominal pain of 5 days duration.  Focal nodular
hyperplasia recently diagnosed in the left lobe of the liver.

CT ABDOMEN AND PELVIS WITH CONTRAST
TECHNIQUE: Multidetector CT imaging of the abdomen and pelvis was
performed following the standard protocol during bolus
administration of intravenous contrast.
Contrast: 100mL OMNIPAQUE IOHEXOL 300 MG/ML  SOLN, 50mL OMNIPAQUE
IOHEXOL 300 MG/ML  SOLN

[Series 2: abd/pel with · axial · 0.95mm/px · z∈[-312,+98]mm · 15 of 92 slices shown, 19 images]
[im 5/92  soft-tissue]
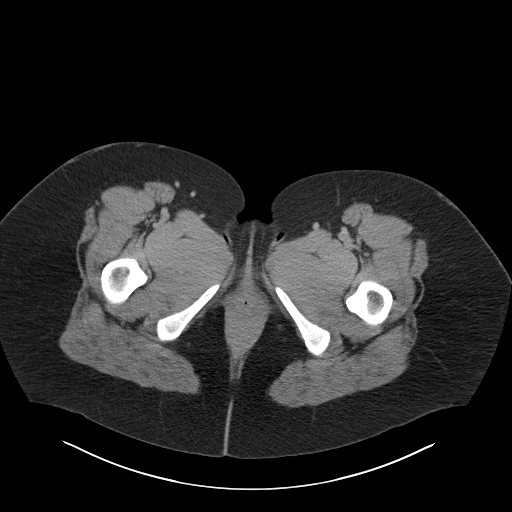
[im 5/92  bone]
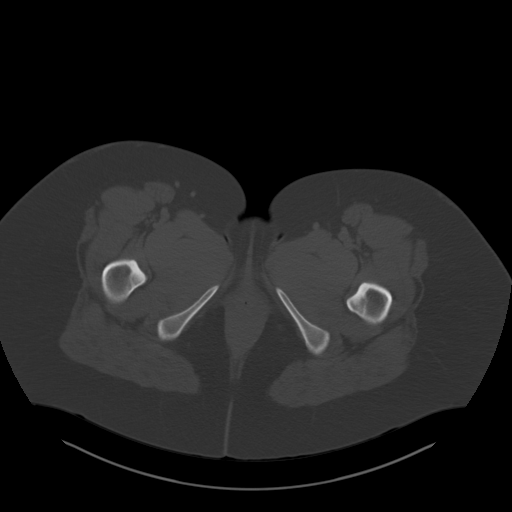
[im 13/92  soft-tissue]
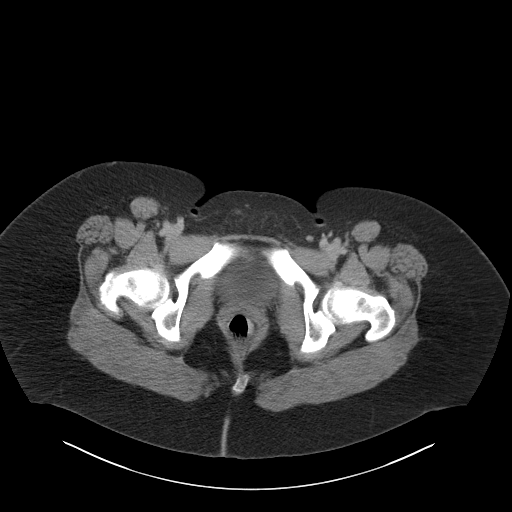
[im 21/92  soft-tissue]
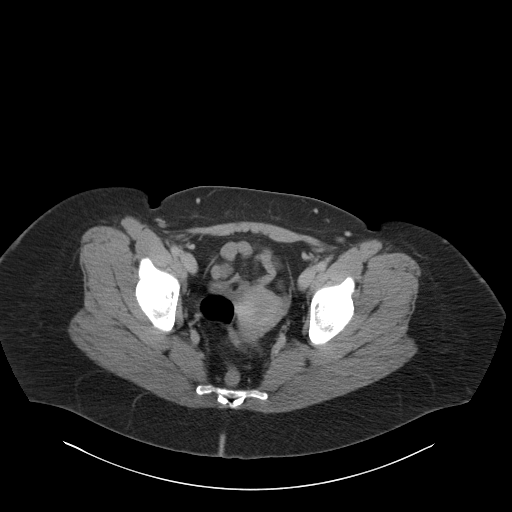
[im 25/92  soft-tissue]
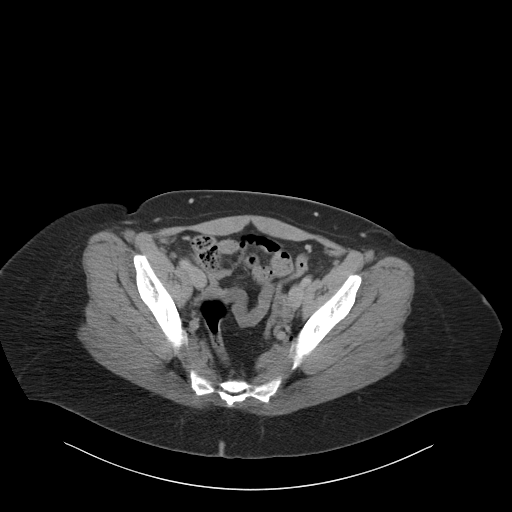
[im 34/92  soft-tissue]
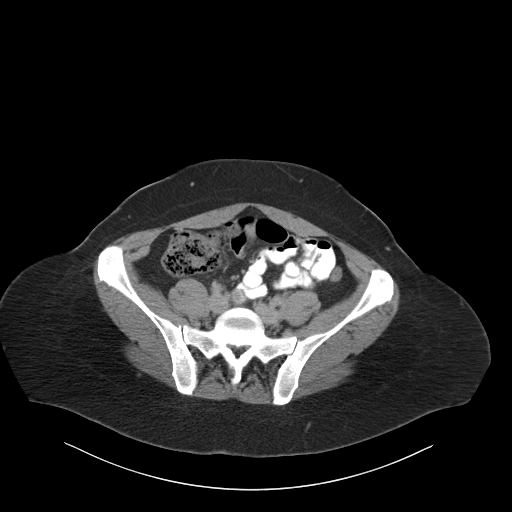
[im 38/92  soft-tissue]
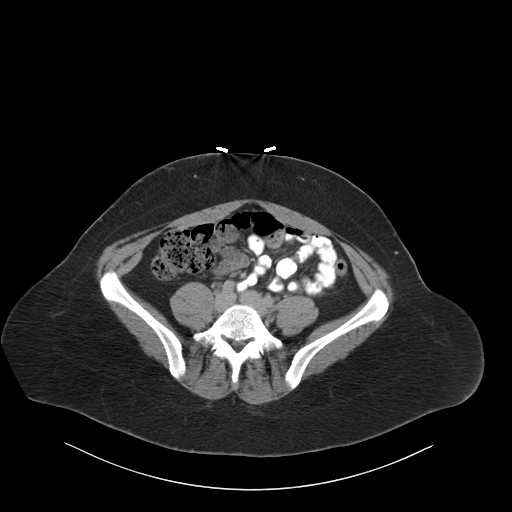
[im 46/92  soft-tissue]
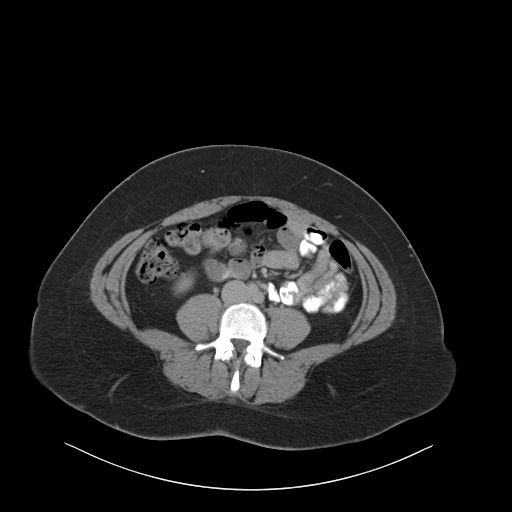
[im 54/92  soft-tissue]
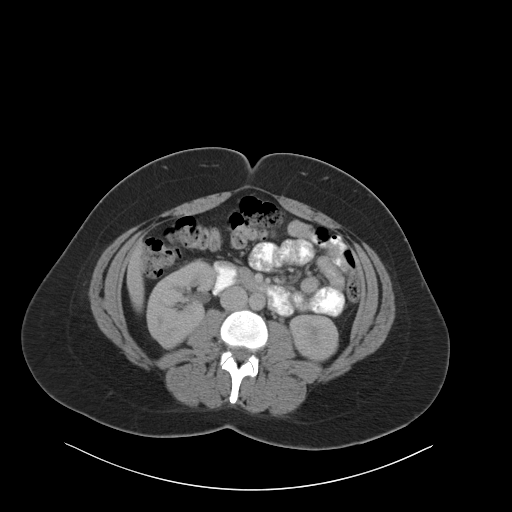
[im 58/92  soft-tissue]
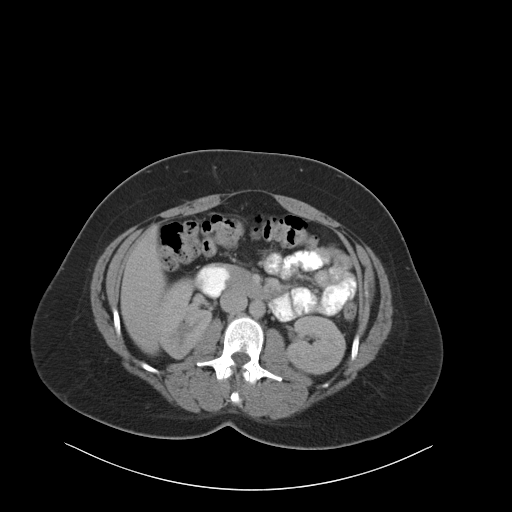
[im 58/92  bone]
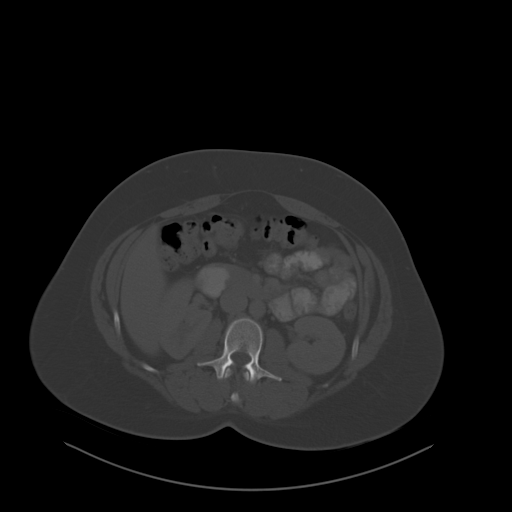
[im 67/92  soft-tissue]
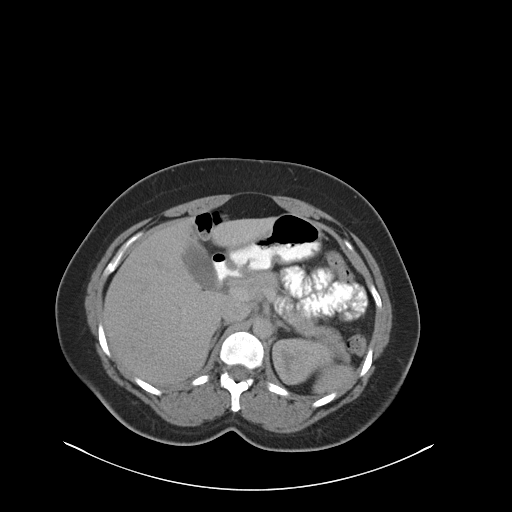
[im 71/92  soft-tissue]
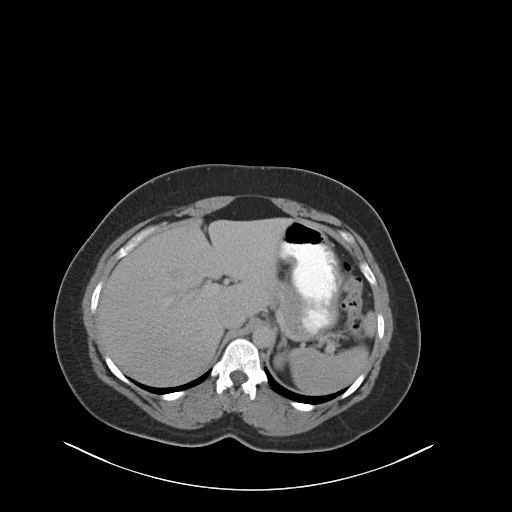
[im 75/92  lung]
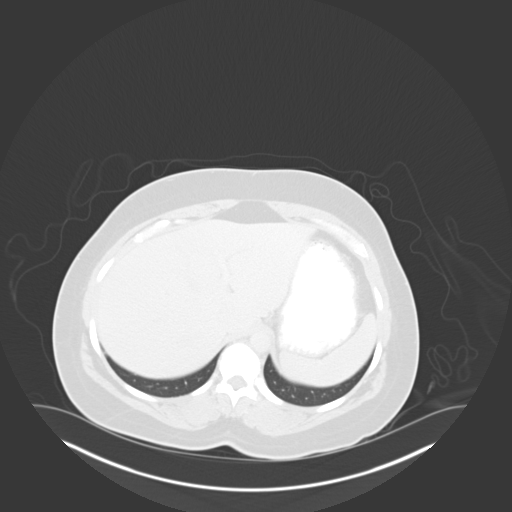
[im 79/92  soft-tissue]
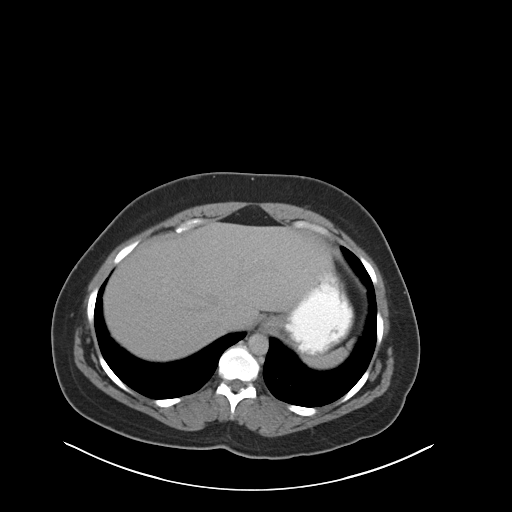
[im 79/92  lung]
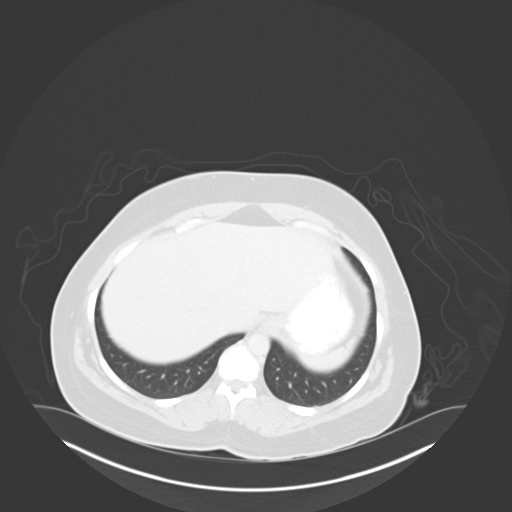
[im 83/92  lung]
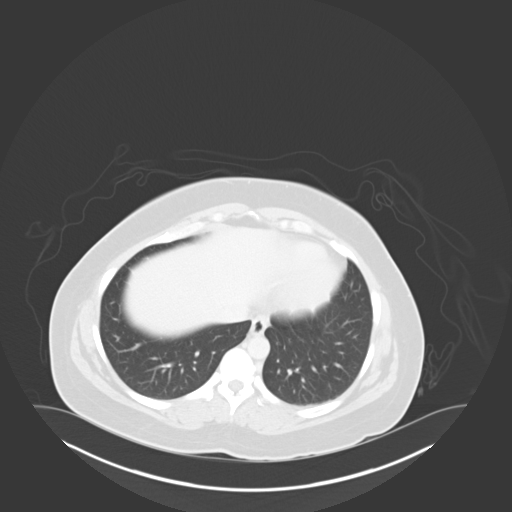
[im 87/92  soft-tissue]
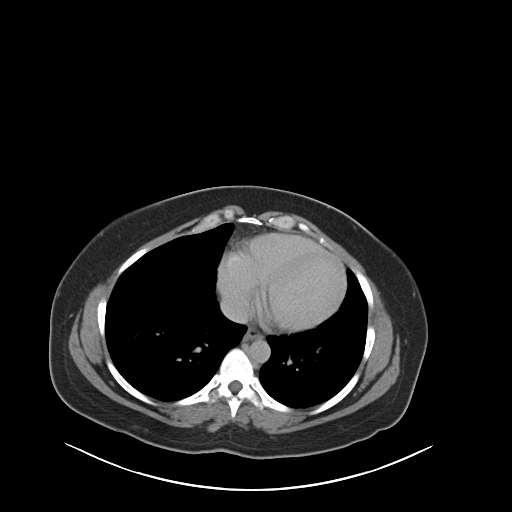
[im 87/92  lung]
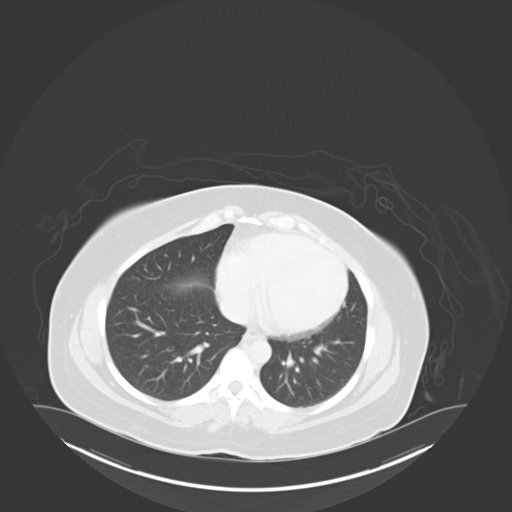

[15 of 32 positions shown; findings below may reference images not displayed]

FINDINGS: Little or no contrast is evident on the images.  The
technologist indicate that the the patient did not have any pain or
swelling at the injection site.  I suspect there was some sort of
malfunction.

Lung bases are clear.  Liver appears normal without contrast.  The
area of focal nodular hyperplasia in the left lobe is not visible
without contrast.  No calcified gallstones.  The spleen is normal.
The pancreas is normal.  The adrenal glands are normal.  The
kidneys are normal.  No bowel pathology is seen.  No free fluid or
air.  The aorta and IVC are normal.  No retroperitoneal adenopathy
ear mass.  Bladder, uterus and adnexal regions appear normal.  No
bony finding.
IMPRESSION: Normal CT scan of the abdomen with the caveat that there is no sign
that contrast reached the patient's blood stream.  I would doubt
that it is necessary to rescan this patient as the clinical history
and the negative findings would seem to exclude significant
processes.

## 2015-10-03 ENCOUNTER — Ambulatory Visit: Payer: Self-pay | Admitting: Internal Medicine

## 2015-10-07 ENCOUNTER — Emergency Department (HOSPITAL_BASED_OUTPATIENT_CLINIC_OR_DEPARTMENT_OTHER): Payer: 59

## 2015-10-07 ENCOUNTER — Emergency Department (HOSPITAL_BASED_OUTPATIENT_CLINIC_OR_DEPARTMENT_OTHER)
Admission: EM | Admit: 2015-10-07 | Discharge: 2015-10-07 | Disposition: A | Discharge status: Home / Self Care | Payer: 59 | Attending: Emergency Medicine | Admitting: Emergency Medicine

## 2015-10-07 ENCOUNTER — Encounter (HOSPITAL_BASED_OUTPATIENT_CLINIC_OR_DEPARTMENT_OTHER): Payer: Self-pay | Admitting: *Deleted

## 2015-10-07 DIAGNOSIS — R011 Cardiac murmur, unspecified: Secondary | ICD-10-CM | POA: Diagnosis not present

## 2015-10-07 DIAGNOSIS — Z9622 Myringotomy tube(s) status: Secondary | ICD-10-CM | POA: Insufficient documentation

## 2015-10-07 DIAGNOSIS — Z79899 Other long term (current) drug therapy: Secondary | ICD-10-CM | POA: Diagnosis not present

## 2015-10-07 DIAGNOSIS — R05 Cough: Secondary | ICD-10-CM | POA: Diagnosis not present

## 2015-10-07 DIAGNOSIS — J45909 Unspecified asthma, uncomplicated: Secondary | ICD-10-CM | POA: Diagnosis not present

## 2015-10-07 DIAGNOSIS — R51 Headache: Secondary | ICD-10-CM | POA: Insufficient documentation

## 2015-10-07 DIAGNOSIS — R509 Fever, unspecified: Secondary | ICD-10-CM | POA: Diagnosis not present

## 2015-10-07 DIAGNOSIS — Z8719 Personal history of other diseases of the digestive system: Secondary | ICD-10-CM | POA: Insufficient documentation

## 2015-10-07 DIAGNOSIS — R112 Nausea with vomiting, unspecified: Secondary | ICD-10-CM

## 2015-10-07 DIAGNOSIS — Z8619 Personal history of other infectious and parasitic diseases: Secondary | ICD-10-CM | POA: Diagnosis not present

## 2015-10-07 DIAGNOSIS — E663 Overweight: Secondary | ICD-10-CM | POA: Insufficient documentation

## 2015-10-07 DIAGNOSIS — R197 Diarrhea, unspecified: Secondary | ICD-10-CM | POA: Insufficient documentation

## 2015-10-07 DIAGNOSIS — Z8669 Personal history of other diseases of the nervous system and sense organs: Secondary | ICD-10-CM | POA: Diagnosis not present

## 2015-10-07 DIAGNOSIS — Z91040 Latex allergy status: Secondary | ICD-10-CM | POA: Diagnosis not present

## 2015-10-07 MED ORDER — SODIUM CHLORIDE 0.9 % IV BOLUS (SEPSIS)
1000.0000 mL | Freq: Once | INTRAVENOUS | Status: AC
  Administered 2015-10-07: 1000 mL via INTRAVENOUS

## 2015-10-07 MED ORDER — METOCLOPRAMIDE HCL 5 MG/ML IJ SOLN
INTRAMUSCULAR | Status: AC
  Filled 2015-10-07: qty 2

## 2015-10-07 MED ORDER — ACETAMINOPHEN 325 MG PO TABS
650.0000 mg | ORAL_TABLET | Freq: Once | ORAL | Status: AC
  Administered 2015-10-07: 650 mg via ORAL
  Filled 2015-10-07: qty 2

## 2015-10-07 MED ORDER — ONDANSETRON HCL 4 MG/2ML IJ SOLN
4.0000 mg | Freq: Once | INTRAMUSCULAR | Status: AC
  Administered 2015-10-07: 4 mg via INTRAVENOUS
  Filled 2015-10-07: qty 2

## 2015-10-07 MED ORDER — METOCLOPRAMIDE HCL 10 MG PO TABS: 10.0000 mg | ORAL_TABLET | Freq: Four times a day (QID) | ORAL | Status: DC

## 2015-10-07 MED ORDER — METOCLOPRAMIDE HCL 5 MG/ML IJ SOLN
10.0000 mg | Freq: Once | INTRAMUSCULAR | Status: AC
  Administered 2015-10-07: 10 mg via INTRAVENOUS

## 2015-10-07 MED FILL — METOCLOPRAMIDE 10 MG TABLET: 10 | 3 days supply | Qty: 15 | Fill #0

## 2015-10-07 NOTE — ED Notes (Signed)
Patient given apple juice for PO Challenge.

## 2015-10-07 NOTE — ED Notes (Signed)
Fever Tuesday, vomited twice today. Headache bodyaches

## 2015-10-07 NOTE — ED Provider Notes (Signed)
CSN: [id number]     Arrival date & time 10/07/15  9562 History   First MD Initiated Contact with Patient 10/07/15 1034     Chief Complaint  Patient presents with  . Emesis  . Fever     (Consider location/radiation/quality/duration/timing/severity/associated sxs/prior Treatment) HPI Comments: Patient presents with complaint of fever, vomiting, headache, cough, body aches starting 3 days ago. Patient is in healthcare and has 2 sick family members with confirmed influenza. She states that she is having difficulty keeping down fluids. No abdominal pain. She notes her urine is dark in color, but denies other urinary symptoms. No rash. She's been treating herself at home with over-the-counter medications without relief. No history of abdominal surgeries. The onset of this condition was acute. The course is constant. Aggravating factors: none. Alleviating factors: none.    The history is provided by the patient.    Past Medical History  Diagnosis Date  . Epilepsy (HCC)     medically cleared and not followed by a physician for 16 yrs  . Fifth disease infant  . Chicken pox 7 yrs old  . Anxiety   . Hyperlipidemia 04/24/2011  . Hx of tympanostomy tubes   . Overweight(278.02) 05/21/2011  . Newly recognized heart murmur 09/12/2011  . Functional dyspepsia 09/12/2011  . Anxiety 04/24/2011  . Reflux 04/18/2012  . Insomnia 04/18/2012  . Bronchitis 07/24/2012  . Asthma with acute exacerbation 08/19/2008    Qualifier: Diagnosis of  By: Andrey Campanile MD, Raliegh Ip     . RUQ pain 09/20/2012   Past Surgical History  Procedure Laterality Date  . Ankle surgery Right 02/2010    removal of tissue and reconstruction  . Tonsillectomy  1996  . Liver surgery     Family History  Problem Relation Age of Onset  . Hypertension Mother   . Hyperlipidemia Father   . Hypertension Father   . Dementia Maternal Grandmother   . Diabetes Paternal Grandmother   . Depression Paternal Grandfather     suicide  . Heart attack  Maternal Aunt   . Colon cancer Neg Hx   . Esophageal cancer Neg Hx   . Stomach cancer Neg Hx   . Rectal cancer Neg Hx    Social History  Substance Use Topics  . Smoking status: Never Smoker   . Smokeless tobacco: Never Used  . Alcohol Use: Yes     Comment: occasionally   OB History    No data available     Review of Systems  Constitutional: Positive for fever and fatigue.  HENT: Negative for rhinorrhea and sore throat.   Eyes: Negative for redness.  Respiratory: Positive for cough.   Cardiovascular: Negative for chest pain.  Gastrointestinal: Positive for nausea and vomiting. Negative for abdominal pain and diarrhea.  Genitourinary: Negative for dysuria.  Musculoskeletal: Positive for myalgias.  Skin: Negative for rash.  Neurological: Positive for headaches.    Allergies  Maxalt mlt and Latex  Home Medications   Prior to Admission medications   Medication Sig Start Date End Date Taking? Authorizing Provider  albuterol (PROVENTIL HFA;VENTOLIN HFA) 108 (90 BASE) MCG/ACT inhaler Inhale 2 puffs into the lungs every 6 (six) hours as needed for wheezing or shortness of breath.   Yes Historical Provider, MD  ALPRAZolam Prudy Feeler) 0.5 MG tablet Take 0.5 mg by mouth at bedtime as needed for anxiety or sleep.   Yes Historical Provider, MD  Artificial Tear Ointment (ARTIFICIAL TEARS) ointment Place 1 drop into both eyes 3 (three) times daily as  needed (dry eyes).    Yes Historical Provider, MD  methocarbamol (ROBAXIN) 500 MG tablet Take 500 mg by mouth every 8 (eight) hours as needed for muscle spasms.   Yes Historical Provider, MD  Multiple Vitamin (MULTIVITAMIN) tablet Take 1 tablet by mouth daily.     Yes Historical Provider, MD  Probiotic Product (PROBIOTIC DAILY PO) Take 1 tablet by mouth daily. Digestive Advantage probiotic gummies by [employer].  Pt sometimes takes 2 at one time   Yes Historical Provider, MD  furosemide (LASIX) 20 MG tablet Take 1 tablet (20 mg total) by mouth daily  as needed for fluid or edema. Patient not taking: Reported on 07/09/2014 06/26/13   Bradd Canary, MD  HYDROcodone-acetaminophen (NORCO/VICODIN) 5-325 MG per tablet Take 1-2 tablets by mouth every 4 (four) hours as needed for moderate pain or severe pain. 07/10/14   Raeford Razor, MD  traMADol (ULTRAM) 50 MG tablet Take 1 tablet (50 mg total) by mouth every 6 (six) hours as needed. 07/09/14   Raeford Razor, MD   BP 122/83 mmHg  Pulse 90  Temp(Src) 98.1 F (36.7 C) (Oral)  Resp 18  Ht  (1.575 m)  Wt 81.647 kg  BMI 32.91 kg/m2  SpO2 99%  LMP 10/06/2015   Physical Exam  Constitutional: She appears well-developed and well-nourished.  HENT:  Head: Normocephalic and atraumatic.  Mouth/Throat: Oropharynx is clear and moist.  Eyes: Conjunctivae are normal. Right eye exhibits no discharge. Left eye exhibits no discharge.  Neck: Normal range of motion. Neck supple.  Cardiovascular: Normal rate, regular rhythm and normal heart sounds.   No murmur heard. Pulmonary/Chest: Effort normal and breath sounds normal. No respiratory distress. She has no wheezes. She has no rales.  Abdominal: Soft. There is no tenderness. There is no rebound and no guarding.  Neurological: She is alert.  Skin: Skin is warm and dry.  Psychiatric: She has a normal mood and affect.  Nursing note and vitals reviewed.   ED Course  Procedures (including critical care time) Labs Review Labs Reviewed - No data to display  Imaging Review Dg Chest 2 View  10/07/2015  CLINICAL DATA:  Pt with cough, congestion, fever, n/v/d, headache, all symptoms x 3 days Hx asthma, albuterol inhaler at home Non-smoker Shielded lmp 10/06/15 EXAM: CHEST - 2 VIEW COMPARISON:  09/06/2014 FINDINGS: Lungs are clear. Heart size and mediastinal contours are within normal limits. No effusion. Visualized skeletal structures are unremarkable. Surgical clips left upper abdomen. IMPRESSION: No acute cardiopulmonary disease. Electronically Signed    By: Corlis Leak M.D.   On: 10/07/2015 11:28   I have personally reviewed and evaluated these images and lab results as part of my medical decision-making.     11:01 AM Patient seen and examined. Work-up initiated. Medications ordered.   Vital signs reviewed and are as follows: BP 122/83 mmHg  Pulse 90  Temp(Src) 98.1 F (36.7 C) (Oral)  Resp 18  Ht  (1.575 m)  Wt 81.647 kg  BMI 32.91 kg/m2  SpO2 99%  LMP 10/06/2015  1:45 PM Patient was improved, however vomiting has resumed. Still c/o HA. Reglan ordered. Will re-eval.   2:17 PM Patient has had some improvement with Reglan.   4:07 PM patient felt much better after Reglan. She has received 2 L IV fluids. She is tolerating oral fluids in the room. She feels well enough to go home at this point. Will discharge to home with oral Reglan.  The patient was urged to return  to the Emergency Department immediately with worsening of current symptoms, worsening abdominal pain, persistent vomiting, blood noted in stools, fever, or any other concerns. The patient verbalized understanding.     MDM   Final diagnoses:  Nausea vomiting and diarrhea   Vitals are stable, no fever. Gastroenteritis vs influenza. No signs of dehydration, tolerating PO's. Lungs are clear. No focal abdominal pain, no concern for appendicitis, cholecystitis, pancreatitis, ruptured viscus, UTI, kidney stone, or any other emergent abdominal etiology. Supportive therapy indicated with return if symptoms worsen. Patient counseled.    Renne Crigler, PA-C 10/07/15 1610  Richardean Canal, MD 10/08/15 [phone]

## 2015-10-07 NOTE — Discharge Instructions (Signed)
Please read and follow all provided instructions.  Your diagnoses today include:  1. Nausea vomiting and diarrhea     Tests performed today include:  Blood counts and electrolytes  Blood tests to check liver and kidney function  Blood tests to check pancreas function  Urine test to look for infection and pregnancy (in women)  Vital signs. See below for your results today.   Medications prescribed:   Reglan - medication for nausea  Take any prescribed medications only as directed.  Home care instructions:   Follow any educational materials contained in this packet.   Your abdominal pain, nausea, vomiting, and diarrhea may be caused by a viral gastroenteritis also called 'stomach flu'. You should rest for the next several days. Keep drinking plenty of fluids and use the medicine for nausea as directed.    Drink clear liquids for the next 24 hours and introduce solid foods slowly after 24 hours using the b.r.a.t. diet (Bananas, Rice, Applesauce, Toast, Yogurt).    Follow-up instructions: Please follow-up with your primary care provider in the next 2-3 days for further evaluation of your symptoms. If you are not feeling better in 48 hours you may have a condition that is more serious and you need re-evaluation.   Return instructions:  SEEK IMMEDIATE MEDICAL ATTENTION IF:  If you have pain that does not go away or becomes severe   A temperature above 101F develops   Repeated vomiting occurs (multiple episodes)   If you have pain that becomes localized to portions of the abdomen. The right side could possibly be appendicitis. In an adult, the left lower portion of the abdomen could be colitis or diverticulitis.   Blood is being passed in stools or vomit (bright red or black tarry stools)   You develop chest pain, difficulty breathing, dizziness or fainting, or become confused, poorly responsive, or inconsolable (young children)  If you have any other emergent concerns  regarding your health  Additional Information: Abdominal (belly) pain can be caused by many things. Your caregiver performed an examination and possibly ordered blood/urine tests and imaging (CT scan, x-rays, ultrasound). Many cases can be observed and treated at home after initial evaluation in the emergency department. Even though you are being discharged home, abdominal pain can be unpredictable. Therefore, you need a repeated exam if your pain does not resolve, returns, or worsens. Most patients with abdominal pain don't have to be admitted to the hospital or have surgery, but serious problems like appendicitis and gallbladder attacks can start out as nonspecific pain. Many abdominal conditions cannot be diagnosed in one visit, so follow-up evaluations are very important.  Your vital signs today were: BP 123/65 mmHg   Pulse 73   Temp(Src) 98.4 F (36.9 C) (Oral)   Resp 18   Ht  (1.575 m)   Wt 81.647 kg   BMI 32.91 kg/m2   SpO2 97%   LMP 10/06/2015 If your blood pressure (bp) was elevated above 135/85 this visit, please have this repeated by your doctor within one month. --------------

## 2016-01-03 ENCOUNTER — Other Ambulatory Visit (HOSPITAL_COMMUNITY): Payer: Self-pay | Admitting: Family Medicine

## 2016-01-03 DIAGNOSIS — R1011 Right upper quadrant pain: Secondary | ICD-10-CM

## 2016-01-05 ENCOUNTER — Ambulatory Visit (HOSPITAL_BASED_OUTPATIENT_CLINIC_OR_DEPARTMENT_OTHER)
Admission: RE | Admit: 2016-01-05 | Discharge: 2016-01-05 | Disposition: A | Discharge status: Home / Self Care | Payer: 59 | Source: Ambulatory Visit | Attending: Family Medicine | Admitting: Family Medicine

## 2016-01-05 DIAGNOSIS — R1011 Right upper quadrant pain: Secondary | ICD-10-CM

## 2016-11-01 DIAGNOSIS — S638X2A Sprain of other part of left wrist and hand, initial encounter: Secondary | ICD-10-CM | POA: Diagnosis not present

## 2016-11-01 DIAGNOSIS — S60222A Contusion of left hand, initial encounter: Secondary | ICD-10-CM | POA: Diagnosis not present

## 2016-11-01 DIAGNOSIS — M79642 Pain in left hand: Secondary | ICD-10-CM | POA: Diagnosis not present

## 2017-01-16 DIAGNOSIS — R591 Generalized enlarged lymph nodes: Secondary | ICD-10-CM | POA: Diagnosis not present

## 2017-02-01 DIAGNOSIS — Z01419 Encounter for gynecological examination (general) (routine) without abnormal findings: Secondary | ICD-10-CM | POA: Diagnosis not present

## 2017-02-01 DIAGNOSIS — N63 Unspecified lump in unspecified breast: Secondary | ICD-10-CM | POA: Diagnosis not present

## 2017-02-28 DIAGNOSIS — R5383 Other fatigue: Secondary | ICD-10-CM | POA: Diagnosis not present

## 2017-02-28 DIAGNOSIS — Z8269 Family history of other diseases of the musculoskeletal system and connective tissue: Secondary | ICD-10-CM | POA: Diagnosis not present

## 2017-04-02 DIAGNOSIS — H52203 Unspecified astigmatism, bilateral: Secondary | ICD-10-CM | POA: Diagnosis not present

## 2017-04-02 DIAGNOSIS — H5213 Myopia, bilateral: Secondary | ICD-10-CM | POA: Diagnosis not present

## 2017-05-02 ENCOUNTER — Encounter: Payer: Self-pay | Admitting: Family Medicine

## 2017-06-20 DIAGNOSIS — R591 Generalized enlarged lymph nodes: Secondary | ICD-10-CM | POA: Diagnosis not present

## 2018-01-02 DIAGNOSIS — J069 Acute upper respiratory infection, unspecified: Secondary | ICD-10-CM | POA: Diagnosis not present

## 2018-04-09 DIAGNOSIS — H5213 Myopia, bilateral: Secondary | ICD-10-CM | POA: Diagnosis not present

## 2018-04-09 DIAGNOSIS — H52203 Unspecified astigmatism, bilateral: Secondary | ICD-10-CM | POA: Diagnosis not present

## 2018-05-09 DIAGNOSIS — Z7689 Persons encountering health services in other specified circumstances: Secondary | ICD-10-CM | POA: Diagnosis not present

## 2018-05-09 DIAGNOSIS — E669 Obesity, unspecified: Secondary | ICD-10-CM | POA: Diagnosis not present

## 2018-05-09 DIAGNOSIS — Z9049 Acquired absence of other specified parts of digestive tract: Secondary | ICD-10-CM | POA: Diagnosis not present

## 2018-05-09 DIAGNOSIS — Z713 Dietary counseling and surveillance: Secondary | ICD-10-CM | POA: Diagnosis not present

## 2018-06-05 DIAGNOSIS — Z01419 Encounter for gynecological examination (general) (routine) without abnormal findings: Secondary | ICD-10-CM | POA: Diagnosis not present

## 2018-06-05 DIAGNOSIS — Z124 Encounter for screening for malignant neoplasm of cervix: Secondary | ICD-10-CM | POA: Diagnosis not present

## 2018-06-05 DIAGNOSIS — Z6836 Body mass index (BMI) 36.0-36.9, adult: Secondary | ICD-10-CM | POA: Diagnosis not present

## 2018-06-06 DIAGNOSIS — Z124 Encounter for screening for malignant neoplasm of cervix: Secondary | ICD-10-CM | POA: Diagnosis not present

## 2018-07-08 DIAGNOSIS — N920 Excessive and frequent menstruation with regular cycle: Secondary | ICD-10-CM | POA: Diagnosis not present

## 2018-07-16 DIAGNOSIS — Z3202 Encounter for pregnancy test, result negative: Secondary | ICD-10-CM | POA: Diagnosis not present

## 2018-07-16 DIAGNOSIS — N920 Excessive and frequent menstruation with regular cycle: Secondary | ICD-10-CM | POA: Diagnosis not present

## 2018-08-11 DIAGNOSIS — Z7689 Persons encountering health services in other specified circumstances: Secondary | ICD-10-CM | POA: Diagnosis not present

## 2018-08-11 DIAGNOSIS — Z6835 Body mass index (BMI) 35.0-35.9, adult: Secondary | ICD-10-CM | POA: Diagnosis not present

## 2018-08-11 DIAGNOSIS — Z713 Dietary counseling and surveillance: Secondary | ICD-10-CM | POA: Diagnosis not present

## 2018-09-18 DIAGNOSIS — J029 Acute pharyngitis, unspecified: Secondary | ICD-10-CM | POA: Diagnosis not present

## 2018-09-18 DIAGNOSIS — B349 Viral infection, unspecified: Secondary | ICD-10-CM | POA: Diagnosis not present

## 2018-09-20 ENCOUNTER — Emergency Department (HOSPITAL_BASED_OUTPATIENT_CLINIC_OR_DEPARTMENT_OTHER)
Admission: EM | Admit: 2018-09-20 | Discharge: 2018-09-20 | Disposition: A | Discharge status: Home / Self Care | Payer: 59 | Attending: Emergency Medicine | Admitting: Emergency Medicine

## 2018-09-20 ENCOUNTER — Emergency Department (HOSPITAL_BASED_OUTPATIENT_CLINIC_OR_DEPARTMENT_OTHER): Payer: 59

## 2018-09-20 ENCOUNTER — Encounter (HOSPITAL_BASED_OUTPATIENT_CLINIC_OR_DEPARTMENT_OTHER): Payer: Self-pay | Admitting: Emergency Medicine

## 2018-09-20 ENCOUNTER — Other Ambulatory Visit: Payer: Self-pay

## 2018-09-20 DIAGNOSIS — Z79899 Other long term (current) drug therapy: Secondary | ICD-10-CM | POA: Diagnosis not present

## 2018-09-20 DIAGNOSIS — J45909 Unspecified asthma, uncomplicated: Secondary | ICD-10-CM | POA: Insufficient documentation

## 2018-09-20 DIAGNOSIS — J111 Influenza due to unidentified influenza virus with other respiratory manifestations: Secondary | ICD-10-CM

## 2018-09-20 DIAGNOSIS — F329 Major depressive disorder, single episode, unspecified: Secondary | ICD-10-CM | POA: Diagnosis not present

## 2018-09-20 DIAGNOSIS — F419 Anxiety disorder, unspecified: Secondary | ICD-10-CM | POA: Insufficient documentation

## 2018-09-20 DIAGNOSIS — R69 Illness, unspecified: Secondary | ICD-10-CM

## 2018-09-20 DIAGNOSIS — R05 Cough: Secondary | ICD-10-CM | POA: Diagnosis present

## 2018-09-20 MED ORDER — PROMETHAZINE HCL 25 MG PO TABS: 25.0000 mg | ORAL_TABLET | Freq: Four times a day (QID) | ORAL | 0 refills | Status: AC | PRN

## 2018-09-20 MED ORDER — IPRATROPIUM-ALBUTEROL 0.5-2.5 (3) MG/3ML IN SOLN
3.0000 mL | Freq: Four times a day (QID) | RESPIRATORY_TRACT | Status: DC
  Administered 2018-09-20: 3 mL via RESPIRATORY_TRACT
  Filled 2018-09-20: qty 3

## 2018-09-20 NOTE — Discharge Instructions (Signed)
You may take Phenergan for your nausea.  Do not drive, work or drink alcohol while taking this medication.  Please follow up with your primary care provider within 5-7 days for re-evaluation of your symptoms. If you do not have a primary care provider, information for a healthcare clinic has been provided for you to make arrangements for follow up care. Please return to the emergency department for any new or worsening symptoms.

## 2018-09-20 NOTE — ED Provider Notes (Signed)
[facility] EMERGENCY DEPARTMENT Provider Note   CSN: [id number] Arrival date & time: 09/20/18  1433     History   Chief Complaint Chief Complaint  Patient presents with  . Cough    HPI Ashley Chang is a 43 y.o. female.  HPI   Patient is a 43 year old female with a history of asthma, bronchitis, epilepsy, GERD, who presents the emergency department today for evaluation of cough.  She states she started having body aches, cough, fevers and sore throat about 2 days ago.  She was seen in urgent care and tested negative for the flu and strep at that time.  Given her symptoms she was started on Tamiflu prophylactically as she had been exposed to the flu by her husband and son.  She states she has been taking this medication without improvement of her symptoms.  She has continued to have a cough and today had blood-streaked mucus.  Denies coughing up any clots.  Has had a little bit of shortness of breath.  No chest pain.  Is having nausea.  No vomiting or diarrhea.  Past Medical History:  Diagnosis Date  . Anxiety   . Anxiety 04/24/2011  . Asthma with acute exacerbation 08/19/2008   Qualifier: Diagnosis of  By: Andrey CampanileWilson MD, Raliegh IpLauraLee     . Bronchitis 07/24/2012  . Chicken pox 43 yrs old  . Epilepsy (HCC)    medically cleared and not followed by a physician for 16 yrs  . Fifth disease infant  . Functional dyspepsia 09/12/2011  . Hx of tympanostomy tubes   . Hyperlipidemia 04/24/2011  . Insomnia 04/18/2012  . Newly recognized heart murmur 09/12/2011  . Overweight(278.02) 05/21/2011  . Reflux 04/18/2012  . RUQ pain 09/20/2012    Patient Active Problem List   Diagnosis Date Noted  . Portal vein thrombosis 10/14/2013  . RUQ abdominal pain 08/27/2013  . Liver lesion 08/27/2013  . Migraine headache without aura 04/06/2013  . Fluid retention 04/06/2013  . RUQ pain 09/20/2012  . Bronchitis 07/24/2012  . Reflux 04/18/2012  . Insomnia 04/18/2012  . Newly recognized heart murmur  09/12/2011  . Overweight(278.02) 05/21/2011  . Hyperlipidemia 04/24/2011  . Depression with anxiety 04/24/2011  . RMS[facility] [facility] fever) 04/24/2011  . Epilepsy (HCC)   . Fifth disease   . Chicken pox   . Hx of tympanostomy tubes   . PALPITATIONS 04/07/2009  . EPISODIC TENSION TYPE HEADACHE 10/25/2008  . ALLERGIC RHINITIS 09/08/2008  . Asthma with acute exacerbation 08/19/2008  . FRACTURE, COCCYX 05/27/2008    Past Surgical History:  Procedure Laterality Date  . ANKLE SURGERY Right 02/2010   removal of tissue and reconstruction  . LIVER SURGERY    . TONSILLECTOMY  1996     OB History   No obstetric history on file.      Home Medications    Prior to Admission medications   Medication Sig Start Date End Date Taking? Authorizing Provider  albuterol (PROVENTIL HFA;VENTOLIN HFA) 108 (90 BASE) MCG/ACT inhaler Inhale 2 puffs into the lungs every 6 (six) hours as needed for wheezing or shortness of breath.    [provider]  ALPRAZolam Prudy Feeler(XANAX) 0.5 MG tablet Take 0.5 mg by mouth at bedtime as needed for anxiety or sleep.    [provider]  Artificial Tear Ointment (ARTIFICIAL TEARS) ointment Place 1 drop into both eyes 3 (three) times daily as needed (dry eyes).     [provider]  methocarbamol (ROBAXIN) 500 MG  tablet Take 500 mg by mouth every 8 (eight) hours as needed for muscle spasms.    [provider]  metoCLOPramide (REGLAN) 10 MG tablet Take 1 tablet (10 mg total) by mouth every 6 (six) hours. 10/07/15   Renne CriglerGeiple, Joshua, PA-C  Multiple Vitamin (MULTIVITAMIN) tablet Take 1 tablet by mouth daily.      [provider]  Probiotic Product (PROBIOTIC DAILY PO) Take 1 tablet by mouth daily. Digestive Advantage probiotic gummies by Schiff.  Pt sometimes takes 2 at one time    [provider]  promethazine (PHENERGAN) 25 MG tablet Take 1 tablet (25 mg total) by mouth every 6 (six) hours as needed for nausea or  vomiting. 09/20/18   [place]ne S, PA-C    Family History Family History  Problem Relation Age of Onset  . Hypertension Mother   . Hyperlipidemia Father   . Hypertension Father   . Dementia Maternal Grandmother   . Diabetes Paternal Grandmother   . Depression Paternal Grandfather        suicide  . Heart attack Maternal Aunt   . Colon cancer Neg Hx   . Esophageal cancer Neg Hx   . Stomach cancer Neg Hx   . Rectal cancer Neg Hx     Social History Social History   Tobacco Use  . Smoking status: Never Smoker  . Smokeless tobacco: Never Used  Substance Use Topics  . Alcohol use: Yes    Comment: occasionally  . Drug use: No     Allergies   Maxalt mlt [rizatriptan] and Latex   Review of Systems Review of Systems  Constitutional: Positive for appetite change, chills and fatigue.  HENT: Positive for sore throat. Negative for congestion and rhinorrhea.   Eyes: Negative for visual disturbance.  Respiratory: Positive for cough and shortness of breath. Negative for wheezing.   Cardiovascular: Negative for chest pain.  Gastrointestinal: Negative for abdominal pain, constipation, diarrhea, nausea and vomiting.  Genitourinary: Negative for dysuria.  Musculoskeletal: Negative for back pain.  Skin: Negative for rash.  Neurological: Negative for headaches.     Physical Exam Updated Vital Signs BP (!) 150/95 (BP Location: Left Arm)   Pulse 94   Temp 98.3 F (36.8 C) (Oral)   Resp 18   Ht 5\' 3"  (1.6 m)   Wt 85.3 kg   LMP 09/04/2018   SpO2 99%   BMI 33.30 kg/m   Physical Exam Vitals signs and nursing note reviewed.  Constitutional:      General: She is not in acute distress.    Appearance: She is well-developed.  HENT:     Head: Normocephalic and atraumatic.     Ears:     Comments: Right TM is retracted. Right TM with clear effusion present.    Nose: Nose normal.     Mouth/Throat:     Mouth: Mucous membranes are moist.     Pharynx: No oropharyngeal exudate  or posterior oropharyngeal erythema.  Eyes:     Conjunctiva/sclera: Conjunctivae normal.     Pupils: Pupils are equal, round, and reactive to light.  Neck:     Musculoskeletal: Neck supple.  Cardiovascular:     Rate and Rhythm: Normal rate and regular rhythm.     Heart sounds: Normal heart sounds. No murmur.  Pulmonary:     Effort: Pulmonary effort is normal. No respiratory distress.     Breath sounds: Normal breath sounds. No stridor. No wheezing or rhonchi.     Comments: Speaking in full  sentences without tachypnea. Abdominal:     General: Bowel sounds are normal.     Palpations: Abdomen is soft.     Tenderness: There is no abdominal tenderness. There is no guarding.  Skin:    General: Skin is warm and dry.  Neurological:     Mental Status: She is alert.  Psychiatric:        Mood and Affect: Mood normal.      ED Treatments / Results  Labs (all labs ordered are listed, but only abnormal results are displayed) Labs Reviewed - No data to display  EKG None  Radiology Dg Chest 2 View  Result Date: 09/20/2018 CLINICAL DATA:  Productive cough and body aches EXAM: CHEST - 2 VIEW COMPARISON:  Chest radiograph 10/07/2015 FINDINGS: Normal cardiac and mediastinal contours. No consolidative pulmonary opacities. No pleural effusion or pneumothorax. Thoracic spine degenerative changes. IMPRESSION: No acute cardiopulmonary process. Electronically Signed   By: Annia Belt M.D.   On: 09/20/2018 15:12    Procedures Procedures (including critical care time)  Medications Ordered in ED Medications  ipratropium-albuterol (DUONEB) 0.5-2.5 (3) MG/3ML nebulizer solution 3 mL (has no administration in time range)     Initial Impression / Assessment and Plan / ED Course  I have reviewed the triage vital signs and the nursing notes.  Pertinent labs & imaging results that were available during my care of the patient were reviewed by me and considered in my medical decision making (see chart  for details).     Final Clinical Impressions(s) / ED Diagnoses   Final diagnoses:  Influenza-like illness   Patient recently diagnosed with flulike illness and started on Tamiflu several days ago.  States she does not feel any better after taking Tamiflu and is still experiencing body aches and nausea.  Also concern for blood-streaked sputum.  No gross hemoptysis.  Satting well on room air here.  No tachycardia or fevers.  Lungs are clear bilaterally.  Patient nontoxic and nonseptic appearing.  Discussed the side effect profile of Tamiflu and that she may be feeling nauseated because of Tamiflu.  Will give Rx for Phenergan to see if this improves her symptoms.  If it does not improve her nausea then advised her to stop taking Tamiflu and transition to supportive care only including rotating Tylenol, Motrin, taking her inhaler and staying well-hydrated.  Advised to follow-up closely with her PCP and return to the ER for new or worsening symptoms.  Patient voiced understanding of this plan and agrees to return if worse.  All questions answered.  ED Discharge Orders         Ordered    promethazine (PHENERGAN) 25 MG tablet  Every 6 hours PRN     09/20/18 1531           Maxie Debose S, PA-C 09/20/18 1532    Pricilla Loveless, MD 09/20/18 1541

## 2018-09-20 NOTE — ED Triage Notes (Signed)
Cough, fever, body aches x 1 week. Tested neg for flu this week. Concerned about blood streaked mucous this morning with SOB

## 2018-10-23 ENCOUNTER — Other Ambulatory Visit: Payer: Self-pay | Admitting: Obstetrics and Gynecology

## 2018-10-23 DIAGNOSIS — N6320 Unspecified lump in the left breast, unspecified quadrant: Secondary | ICD-10-CM

## 2018-10-28 ENCOUNTER — Other Ambulatory Visit: Payer: 59

## 2018-10-30 ENCOUNTER — Other Ambulatory Visit: Payer: Self-pay

## 2018-10-30 ENCOUNTER — Ambulatory Visit
Admission: RE | Admit: 2018-10-30 | Discharge: 2018-10-30 | Disposition: A | Discharge status: Home / Self Care | Payer: 59 | Source: Ambulatory Visit | Attending: Obstetrics and Gynecology | Admitting: Obstetrics and Gynecology

## 2018-10-30 DIAGNOSIS — N6320 Unspecified lump in the left breast, unspecified quadrant: Secondary | ICD-10-CM | POA: Diagnosis not present

## 2018-11-25 DIAGNOSIS — Z713 Dietary counseling and surveillance: Secondary | ICD-10-CM | POA: Diagnosis not present

## 2018-11-25 DIAGNOSIS — E669 Obesity, unspecified: Secondary | ICD-10-CM | POA: Diagnosis not present

## 2018-11-25 DIAGNOSIS — Z6835 Body mass index (BMI) 35.0-35.9, adult: Secondary | ICD-10-CM | POA: Diagnosis not present

## 2018-12-01 DIAGNOSIS — M5441 Lumbago with sciatica, right side: Secondary | ICD-10-CM | POA: Diagnosis not present

## 2019-05-21 ENCOUNTER — Ambulatory Visit: Payer: 59 | Admitting: Cardiovascular Disease

## 2019-06-18 NOTE — Progress Notes (Signed)
Cardiology Office Note:    Date:  06/19/2019   ID:  Ashley Chang, DOB 08/03/1976, MRN [id number]  PCP:  Camille Bal, PA-C  Cardiologist:  Kathleene Bergemann   Electrophysiologist:  None   Referring MD: No ref. provider found   Chief Complaint  Patient presents with  . Palpitations    History of Present Illness:    Ashley Chang is a 43 y.o. female with a hx of palpitation She is a PACU nurse She had palpitations while at work .  Co-workers placed her on a monitor.  Found bigeminy Has palpitations every other day  No caffiene.   Perhaps 1 green tea a week  No tea, no coffee, no soft drinks  Has palpitations QOD , Not related to menstral cycle   Worse wihen she is sleep deprived.  Recently moved to [place] , [place] and shows [employer]  Reviewed meds.   No weight gain , loss, no diarrhea, no hair loss,  Exercises regularly  Owns a farm and 7 horsed,  Runs on occasion   Now works at [employer],( formerly was at [employer] )     Past Medical History:  Diagnosis Date  . Acute cystitis 04/07/2009   Qualifier: Diagnosis of  By: Esmeralda Arthur    . Acute frontal sinusitis 03/12/2008   Qualifier: Diagnosis of  By: Redmond Pulling MD, Frann Rider    . Anxiety   . Anxiety 04/24/2011  . Asthma with acute exacerbation 08/19/2008   Qualifier: Diagnosis of  By: Redmond Pulling MD, Frann Rider     . Bronchitis 07/24/2012  . Chicken pox 43 yrs old  . Depression with anxiety 04/24/2011  . Epilepsy ([place])    medically cleared and not followed by a physician for 16 yrs  . Episodic tension type headache 10/25/2008   Qualifier: Diagnosis of  By: Redmond Pulling MD, Frann Rider    . Fifth disease infant  . Fluid retention 04/06/2013  . FRACTURE, COCCYX 05/27/2008   Qualifier: Diagnosis of  By: Redmond Pulling MD, Frann Rider    . Functional dyspepsia 09/12/2011  . Hx of tympanostomy tubes   . Hyperlipidemia 04/24/2011  . Insomnia 04/18/2012  . Liver lesion 08/27/2013  . Migraine headache without aura 04/06/2013   . Newly recognized heart murmur 09/12/2011  . Overweight(278.02) 05/21/2011  . Palpitations 04/07/2009   Qualifier: Diagnosis of  By: Esmeralda Arthur    . Portal vein thrombosis 10/14/2013  . Reflux 04/18/2012  . RMSF [facility] spotted fever) 04/24/2011   H/o 2007   . RUQ pain 09/20/2012    Past Surgical History:  Procedure Laterality Date  . ANKLE SURGERY Right 02/2010   removal of tissue and reconstruction  . LIVER SURGERY    . TONSILLECTOMY  1996    Current Medications: Current Meds  Medication Sig  . albuterol (PROVENTIL HFA;VENTOLIN HFA) 108 (90 BASE) MCG/ACT inhaler Inhale 2 puffs into the lungs every 6 (six) hours as needed for wheezing or shortness of breath.  . ALPRAZolam (XANAX) 0.5 MG tablet Take 0.5 mg by mouth at bedtime as needed for anxiety or sleep.  . Artificial Tear Ointment (ARTIFICIAL TEARS) ointment Place 1 drop into both eyes 3 (three) times daily as needed (dry eyes).   . methocarbamol (ROBAXIN) 500 MG tablet Take 500 mg by mouth every 8 (eight) hours as needed for muscle spasms.  . Multiple Vitamin (MULTIVITAMIN) tablet Take 1 tablet by mouth daily.    . promethazine (PHENERGAN) 25 MG tablet Take 1 tablet (  25 mg total) by mouth every 6 (six) hours as needed for nausea or vomiting.     Allergies:   Rizatriptan and Latex   Social History   Socioeconomic History  . Marital status: Married    Spouse name: Not on file  . Number of children: 1  . Years of education: Not on file  . Highest education level: Not on file  Occupational History  . Occupation: Teacher, adult education: [employer]  Social Needs  . Financial resource strain: Not on file  . Food insecurity    Worry: Not on file    Inability: Not on file  . Transportation needs    Medical: Not on file    Non-medical: Not on file  Tobacco Use  . Smoking status: Never Smoker  . Smokeless tobacco: Never Used  Substance and Sexual Activity  . Alcohol use: Yes    Comment: occasionally  . Drug use: No   . Sexual activity: Yes    Partners: Male    Birth control/protection: None  Lifestyle  . Physical activity    Days per week: Not on file    Minutes per session: Not on file  . Stress: Not on file  Relationships  . Social Musician on phone: Not on file    Gets together: Not on file    Attends religious service: Not on file    Active member of club or organization: Not on file    Attends meetings of clubs or organizations: Not on file    Relationship status: Not on file  Other Topics Concern  . Not on file  Social History Narrative  . Not on file     Family History: The patient's family history includes Dementia in her maternal grandmother; Depression in her paternal grandfather; Diabetes in her paternal grandmother; Heart attack in her maternal aunt; Hyperlipidemia in her father; Hypertension in her father and mother. There is no history of Colon cancer, Esophageal cancer, Stomach cancer, Rectal cancer, or Breast cancer.  ROS:   Please see the history of present illness.     All other systems reviewed and are negative.  EKGs/Labs/Other Studies Reviewed:    The following studies were reviewed today:   EKG:  Nov. 6, 2020  NSR at 10.   Recent Labs: No results found for requested labs within last 8760 hours.  Recent Lipid Panel    Component Value Date/Time   CHOL 256 (H) 10/03/2011 1530   TRIG 99 10/03/2011 1530   HDL 53 10/03/2011 1530   CHOLHDL 4.8 10/03/2011 1530   VLDL 20 10/03/2011 1530   LDLCALC 183 (H) 10/03/2011 1530    Physical Exam:    VS:  BP 130/84   Pulse 66   Ht 5\' 2"  (1.575 m)   Wt 198 lb 12.8 oz (90.2 kg)   SpO2 98%   BMI 36.36 kg/m     Wt Readings from Last 3 Encounters:  06/19/19 198 lb 12.8 oz (90.2 kg)  09/20/18 188 lb (85.3 kg)  10/07/15 180 lb (81.6 kg)     GEN:  Well nourished, well developed in no acute distress HEENT: Normal NECK: No JVD; No carotid bruits LYMPHATICS: No lymphadenopathy CARDIAC: RRR, no murmurs,  rubs, gallops RESPIRATORY:  Clear to auscultation without rales, wheezing or rhonchi  ABDOMEN: Soft, non-tender, non-distended MUSCULOSKELETAL:  No edema; No deformity  SKIN: Warm and dry NEUROLOGIC:  Alert and oriented x 3 PSYCHIATRIC:  Normal affect  ASSESSMENT:    1. Palpitations    PLAN:    In order of problems listed above:  1. Palpitations: Ashley Chang  presents for further evaluation of her palpitations.  She hooked herself up to the monitor while at work and found that she has PVCs and occasional bigeminy.  These episodes are especially worse after she is been up for many hours.  We will place an event monitor on her for further evaluation.  2.  Mitral regurgitation: She has a very soft systolic murmur.  She had an echocardiogram performed in 2014 which revealed normal left ventricular systolic function.  She has mild mitral regurgitation.  I will see her again in 3 months.    Medication Adjustments/Labs and Tests Ordered: Current medicines are reviewed at length with the patient today.  Concerns regarding medicines are outlined above.  Orders Placed This Encounter  Procedures  . TSH  . Basic Metabolic Panel (BMET)  . CBC  . Cardiac event monitor  . EKG 12-Lead   No orders of the defined types were placed in this encounter.   Patient Instructions  Medication Instructions:  Your physician recommends that you continue on your current medications as directed. Please refer to the Current Medication list given to you today.  *If you need a refill on your cardiac medications before your next appointment, please call your pharmacy*   Lab Work: TODAY - TSH, BMET, CBC If you have labs (blood work) drawn today and your tests are completely normal, you will receive your results only by: Marland Kitchen. MyChart Message (if you have MyChart) OR . A paper copy in the mail If you have any lab test that is abnormal or we need to change your treatment, we will call you to review the results.    Testing/Procedures: Your physician has recommended that you wear an event monitor. Event monitors are medical devices that record the heart's electrical activity. Doctors most often us these monitors to diagnose arrhythmias. Arrhythmias are problems with the speed or rhythm of the heartbeat. The monitor is a small, portable device. You can wear one while you do your normal daily activities. This is usually used to diagnose what is causing palpitations/syncope (passing out).     Follow-Up: At [facility], you and your health needs are our priority.  As part of our continuing mission to provide you with exceptional heart care, we have created designated Provider Care Teams.  These Care Teams include your primary Cardiologist (physician) and Advanced Practice Providers (APPs -  Physician Assistants and Nurse Practitioners) who all work together to provide you with the care you need, when you need it.  Your next appointment:   3 months on February 9  The format for your next appointment:   In Person  Provider:   You may see Dr. Elease HashimotoNahser or one of the following Advanced Practice Providers on your designated Care Team:    Tereso NewcomerScott Weaver, PA-C  Vin Cedar HillsBhagat, [place] JerseyPA-C  Berton BonJanine Hammond, NP   Increase your intake of fluids (water with electrolyte tabs like Nun tablets, or gatorade) , protein ( hard boiled eggs, chicken, fish) , and a electrolytes ( V-8 juice, salt, potassium chloride  which is sold as No-Salt     Signed, Kristeen MissPhilip Nathania Waldman, MD  06/19/2019 5:40 PM    [facility]

## 2019-06-19 ENCOUNTER — Ambulatory Visit (INDEPENDENT_AMBULATORY_CARE_PROVIDER_SITE_OTHER): Payer: 59 | Admitting: Cardiovascular Disease

## 2019-06-19 ENCOUNTER — Telehealth: Payer: Self-pay

## 2019-06-19 ENCOUNTER — Other Ambulatory Visit: Payer: Self-pay

## 2019-06-19 VITALS — BP 130/84 | HR 66 | Ht 62.0 in | Wt 198.8 lb

## 2019-06-19 DIAGNOSIS — R002 Palpitations: Secondary | ICD-10-CM | POA: Diagnosis not present

## 2019-06-19 NOTE — Telephone Encounter (Signed)
Spoke to pt. Went over monitor instructions. Verified address to where she wants monitor sent. 30 day [employer] monitor ordered.

## 2019-06-19 NOTE — Telephone Encounter (Signed)
-----   Message from Emmaline Life, RN sent at 06/19/2019  2:38 PM EST ----- Please [place] [address]   Thank you! Sharyn Lull

## 2019-06-19 NOTE — Patient Instructions (Addendum)
Medication Instructions:  Your physician recommends that you continue on your current medications as directed. Please refer to the Current Medication list given to you today.  *If you need a refill on your cardiac medications before your next appointment, please call your pharmacy*   Lab Work: TODAY - TSH, BMET, CBC If you have labs (blood work) drawn today and your tests are completely normal, you will receive your results only by: Marland Kitchen MyChart Message (if you have MyChart) OR . A paper copy in the mail If you have any lab test that is abnormal or we need to change your treatment, we will call you to review the results.   Testing/Procedures: Your physician has recommended that you wear an event monitor. Event monitors are medical devices that record the heart's electrical activity. Doctors most often [place] these monitors to diagnose arrhythmias. Arrhythmias are problems with the speed or rhythm of the heartbeat. The monitor is a small, portable device. You can wear one while you do your normal daily activities. This is usually used to diagnose what is causing palpitations/syncope (passing out).     Follow-Up: At [facility], you and your health needs are our priority.  As part of our continuing mission to provide you with exceptional heart care, we have created designated Provider Care Teams.  These Care Teams include your primary Cardiologist (physician) and Advanced Practice Providers (APPs -  Physician Assistants and Nurse Practitioners) who all work together to provide you with the care you need, when you need it.  Your next appointment:   3 months on February 9  The format for your next appointment:   In Person  Provider:   You may see Dr. Acie Fredrickson or one of the following Advanced Practice Providers on your designated Care Team:    Richardson Dopp, PA-C  [place], [place]  Daune Perch, NP   Increase your intake of fluids (water with electrolyte tabs like Nun tablets, or  gatorade) , protein ( hard boiled eggs, chicken, fish) , and a electrolytes ( V-8 juice, salt, potassium chloride  which is sold as No-Salt

## 2019-06-20 LAB — CBC
Hematocrit: 41.8 % (ref 34.0–46.6)
Hemoglobin: 13.8 g/dL (ref 11.1–15.9)
MCH: 31.6 pg (ref 26.6–33.0)
MCHC: 33 g/dL (ref 31.5–35.7)
MCV: 96 fL (ref 79–97)
Platelets: 256 10*3/uL (ref 150–450)
RBC: 4.37 x10E6/uL (ref 3.77–5.28)
RDW: 11.5 % — ABNORMAL LOW (ref 11.7–15.4)
WBC: 8.6 10*3/uL (ref 3.4–10.8)

## 2019-06-20 LAB — BASIC METABOLIC PANEL
BUN/Creatinine Ratio: 15 (ref 9–23)
BUN: 13 mg/dL (ref 6–24)
CO2: 24 mmol/L (ref 20–29)
Calcium: 9.2 mg/dL (ref 8.7–10.2)
Chloride: 103 mmol/L (ref 96–106)
Creatinine, Ser: 0.88 mg/dL (ref 0.57–1.00)
GFR calc Af Amer: 94 mL/min/{1.73_m2} (ref >59)
GFR calc non Af Amer: 82 mL/min/{1.73_m2} (ref >59)
Glucose: 86 mg/dL (ref 65–99)
Potassium: 4.5 mmol/L (ref 3.5–5.2)
Sodium: 141 mmol/L (ref 134–144)

## 2019-06-20 LAB — TSH: TSH: 1.84 u[IU]/mL (ref 0.450–4.500)

## 2019-07-01 ENCOUNTER — Ambulatory Visit (INDEPENDENT_AMBULATORY_CARE_PROVIDER_SITE_OTHER): Payer: 59

## 2019-07-01 ENCOUNTER — Encounter: Payer: Self-pay | Admitting: Cardiovascular Disease

## 2019-07-01 DIAGNOSIS — R002 Palpitations: Secondary | ICD-10-CM

## 2019-08-03 ENCOUNTER — Telehealth: Payer: Self-pay

## 2019-08-03 NOTE — Telephone Encounter (Signed)
Received an auto triggered report from event monitor on 12/19 at 12:10AM. Attempted to call patient but no answer and unable to leave a voicemail. Report states that the patient was dizzy and felt a skipped beat and rapid heart rate. Reviewed by DOD. Dr. Johnsie Cancel, NSVT, patient to continue wearing monitor.

## 2019-08-17 ENCOUNTER — Telehealth: Payer: Self-pay | Admitting: Nurse Practitioner

## 2019-08-17 MED ORDER — METOPROLOL SUCCINATE ER 25 MG PO TB24: 25.0000 mg | ORAL_TABLET | Freq: Every day | ORAL | 11 refills | Status: DC

## 2019-08-17 NOTE — Telephone Encounter (Signed)
-----   Message from Vesta Mixer, MD sent at 08/06/2019 10:12 AM EST ----- Sinus rhythm.   She had an 11 beat run of nonsustained VT that occurred at 11:10 pm on Dec. 18, 2020.     She has had PVCs in the past.  These episodes have tended to occur when she is sleep deprived or fatigued.   No further work up indicated at this time .  We will discuss further when I see her in Feb for follow up

## 2019-08-17 NOTE — Telephone Encounter (Signed)
We can try Toprol XL 25 mg a day for these episodes of SVT ( will also help prevent any episodes of nonsustained VT) HR is a little slow but she should be able to tolerate this low dose.

## 2019-08-17 NOTE — Telephone Encounter (Signed)
Reviewed monitor results with patient who verbalized understanding. She reports severe skin sensitivity with the adhesive from the monitor pads and had to disconnect the monitor for a few days during the monitor period. Reports since she returned the monitor she has had 2 episodes of SVT. She is agreeable to taking medication if Dr. Elease Hashimoto feels that would be appropriate. I advised that I will review with him and call her back with his advice. She verbalized understanding and agreement and thanked me for the call.

## 2019-08-17 NOTE — Telephone Encounter (Signed)
Notified patient of Dr. Harvie Bridge advice. She agrees to monitor HR and BP and will reduce Toprol XL to 12.5 mg daily if she does not tolerate 25 mg daily. I advised her to call back with questions or concerns prior to her appointment with Dr. Elease Hashimoto in February. She thanked me for the call.

## 2019-09-08 ENCOUNTER — Telehealth: Payer: Self-pay | Admitting: Cardiovascular Disease

## 2019-09-08 NOTE — Telephone Encounter (Signed)
Patient wanted me to send a message to Dr. Harvie Bridge nurse. She wanted to apologize for having to reschedule her appointment with Dr. Elease Hashimoto. Her request off for work was denied but she wanted to let Marcelino Duster know that the metoprolol does seem to be working.

## 2019-09-08 NOTE — Telephone Encounter (Signed)
Patient is calling to apologize for having to reschedule her appointment with Dr. Elease Hashimoto. 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 She states that Marcelino Duster has been wonderful to her so she just wanted to let her know that she was sorry. She also states that the metoprolol has been working but she is having to taking it at night because it was making her drowsy during the day. Patient was able to reschedule her appointment for 10/15/20.

## 2019-09-22 ENCOUNTER — Ambulatory Visit: Payer: 59 | Admitting: Cardiovascular Disease

## 2019-10-16 ENCOUNTER — Ambulatory Visit (HOSPITAL_COMMUNITY): Payer: 59 | Attending: Internal Medicine

## 2019-10-16 ENCOUNTER — Ambulatory Visit
Admission: RE | Admit: 2019-10-16 | Discharge: 2019-10-16 | Disposition: A | Discharge status: Home / Self Care | Payer: Self-pay | Source: Ambulatory Visit | Attending: Cardiovascular Disease | Admitting: Cardiovascular Disease

## 2019-10-16 ENCOUNTER — Ambulatory Visit (INDEPENDENT_AMBULATORY_CARE_PROVIDER_SITE_OTHER): Payer: 59 | Admitting: Cardiovascular Disease

## 2019-10-16 ENCOUNTER — Encounter: Payer: Self-pay | Admitting: Cardiovascular Disease

## 2019-10-16 ENCOUNTER — Other Ambulatory Visit: Payer: Self-pay

## 2019-10-16 VITALS — BP 118/82 | HR 76 | Ht 62.0 in | Wt 198.5 lb

## 2019-10-16 DIAGNOSIS — I2510 Atherosclerotic heart disease of native coronary artery without angina pectoris: Secondary | ICD-10-CM | POA: Diagnosis not present

## 2019-10-16 DIAGNOSIS — E785 Hyperlipidemia, unspecified: Secondary | ICD-10-CM | POA: Diagnosis not present

## 2019-10-16 DIAGNOSIS — I4729 Other ventricular tachycardia: Secondary | ICD-10-CM

## 2019-10-16 DIAGNOSIS — I472 Ventricular tachycardia: Secondary | ICD-10-CM

## 2019-10-16 DIAGNOSIS — I2584 Coronary atherosclerosis due to calcified coronary lesion: Secondary | ICD-10-CM | POA: Diagnosis not present

## 2019-10-16 LAB — BASIC METABOLIC PANEL
BUN/Creatinine Ratio: 20 (ref 9–23)
BUN: 17 mg/dL (ref 6–24)
CO2: 21 mmol/L (ref 20–29)
Calcium: 9.2 mg/dL (ref 8.7–10.2)
Chloride: 102 mmol/L (ref 96–106)
Creatinine, Ser: 0.87 mg/dL (ref 0.57–1.00)
GFR calc Af Amer: 96 mL/min/{1.73_m2} (ref >59)
GFR calc non Af Amer: 83 mL/min/{1.73_m2} (ref >59)
Glucose: 82 mg/dL (ref 65–99)
Potassium: 4 mmol/L (ref 3.5–5.2)
Sodium: 139 mmol/L (ref 134–144)

## 2019-10-16 LAB — LIPID PANEL
Chol/HDL Ratio: 4 ratio (ref 0.0–4.4)
Cholesterol, Total: 235 mg/dL — ABNORMAL HIGH (ref 100–199)
HDL: 59 mg/dL (ref >39)
LDL Chol Calc (NIH): 166 mg/dL — ABNORMAL HIGH (ref 0–99)
Triglycerides: 61 mg/dL (ref 0–149)
VLDL Cholesterol Cal: 10 mg/dL (ref 5–40)

## 2019-10-16 LAB — ECHOCARDIOGRAM COMPLETE
Height: 62 in
Weight: 3176 oz

## 2019-10-16 LAB — HEPATIC FUNCTION PANEL
ALT: 14 IU/L (ref 0–32)
AST: 19 IU/L (ref 0–40)
Albumin: 4.8 g/dL (ref 3.8–4.8)
Alkaline Phosphatase: 68 IU/L (ref 39–117)
Bilirubin Total: 0.6 mg/dL (ref 0.0–1.2)
Bilirubin, Direct: 0.14 mg/dL (ref 0.00–0.40)
Total Protein: 6.8 g/dL (ref 6.0–8.5)

## 2019-10-16 NOTE — Patient Instructions (Addendum)
Medication Instructions:  Your provider recommends that you continue on your current medications as directed. Please refer to the Current Medication list given to you today.   *If you need a refill on your cardiac medications before your next appointment, please call your pharmacy*  Labs: TODAY: BMET, liver, lipids  Testing/Procedures: Your provider has requested that you have an echocardiogram. Echocardiography is a painless test that uses sound waves to create images of your heart. It provides your doctor with information about the size and shape of your heart and how well your heart's chambers and valves are working. This procedure takes approximately one hour. There are no restrictions for this procedure.    Dr. Elease Hashimoto recommends you have a CALCIUM SCORE.  Follow-Up: You have a follow-up appointment in June with Dr. Elease Hashimoto.

## 2019-10-16 NOTE — Progress Notes (Signed)
Cardiology Office Note:    Date:  10/19/2019   ID:  Ashley Chang, DOB 1976-05-17, MRN [id number]  PCP:  Aurea Graff, PA-C  Cardiologist:  Caileigh Canche   Electrophysiologist:  None   Referring MD: Aurea Graff, PA-C   Chief Complaint  Patient presents with  . Palpitations    History of Present Illness:    Ashley Chang is a 44 y.o. female with a hx of palpitation She is a PACU nurse She had palpitations while at work .  Co-workers placed her on a monitor.  Found bigeminy Has palpitations every other day  No caffiene.   Perhaps 1 green tea a week  No tea, no coffee, no soft drinks  Has palpitations QOD , Not related to menstral cycle   Worse wihen she is sleep deprived.  Recently moved to [place] , [place] and shows [place]  Reviewed meds.   No weight gain , loss, no diarrhea, no hair loss,  Exercises regularly  Owns a farm and 7 horsed,  Runs on occasion   Now works at [employer],( formerly was at [employer] )   October 16, 2019:  She found out she is allergic to the adhesive on the monitor  She was found to have an episode of nonsustained ventricular tachycardia-11 beats.  We tried her on Toprol-XL 25 mg a day but this caused profound lethargy.  She also had evidence of sinus bradycardia on the monitor.  She was only able to tolerate the Toprol for 3 days and then had to stop.  She is feeling better since that time.  She is continued to have episodes of palpitations-once or twice a week which last for a few seconds or so.  Denies any syncope.  We do not have a current assessment of her LV function.  Her echocardiogram from September, 2014 reveals normal left ventricular systolic function with an ejection fraction of 50 to 55%.  She has mild mitral regurgitation.  Past Medical History:  Diagnosis Date  . Acute cystitis 04/07/2009   Qualifier: Diagnosis of  By: Esmeralda Arthur    . Acute frontal sinusitis 03/12/2008   Qualifier: Diagnosis  of  By: Redmond Pulling MD, Frann Rider    . Anxiety   . Anxiety 04/24/2011  . Asthma with acute exacerbation 08/19/2008   Qualifier: Diagnosis of  By: Redmond Pulling MD, Frann Rider     . Bronchitis 07/24/2012  . Chicken pox 44 yrs old  . Depression with anxiety 04/24/2011  . Epilepsy ([place])    medically cleared and not followed by a physician for 16 yrs  . Episodic tension type headache 10/25/2008   Qualifier: Diagnosis of  By: Redmond Pulling MD, Frann Rider    . Fifth disease infant  . Fluid retention 04/06/2013  . FRACTURE, COCCYX 05/27/2008   Qualifier: Diagnosis of  By: Redmond Pulling MD, Frann Rider    . Functional dyspepsia 09/12/2011  . Hx of tympanostomy tubes   . Hyperlipidemia 04/24/2011  . Insomnia 04/18/2012  . Liver lesion 08/27/2013  . Migraine headache without aura 04/06/2013  . Newly recognized heart murmur 09/12/2011  . Overweight(278.02) 05/21/2011  . Palpitations 04/07/2009   Qualifier: Diagnosis of  By: Esmeralda Arthur    . Portal vein thrombosis 10/14/2013  . Reflux 04/18/2012  . [facility] spotted fever) 04/24/2011   H/o 2007   . RUQ pain 09/20/2012    Past Surgical History:  Procedure Laterality Date  . ANKLE SURGERY Right 02/2010   removal  of tissue and reconstruction  . LIVER SURGERY    . TONSILLECTOMY  1996    Current Medications: Current Meds  Medication Sig  . albuterol (PROVENTIL HFA;VENTOLIN HFA) 108 (90 BASE) MCG/ACT inhaler Inhale 2 puffs into the lungs every 6 (six) hours as needed for wheezing or shortness of breath.  . ALPRAZolam (XANAX) 0.5 MG tablet Take 0.5 mg by mouth at bedtime as needed for anxiety or sleep.  . Artificial Tear Ointment (ARTIFICIAL TEARS) ointment Place 1 drop into both eyes 3 (three) times daily as needed (dry eyes).   . methocarbamol (ROBAXIN) 500 MG tablet Take 500 mg by mouth every 8 (eight) hours as needed for muscle spasms.  . Multiple Vitamin (MULTIVITAMIN) tablet Take 1 tablet by mouth daily.    . promethazine (PHENERGAN) 25 MG tablet Take 1 tablet (25 mg  total) by mouth every 6 (six) hours as needed for nausea or vomiting.     Allergies:   Rizatriptan and Latex   Social History   Socioeconomic History  . Marital status: Married    Spouse name: Not on file  . Number of children: 1  . Years of education: Not on file  . Highest education level: Not on file  Occupational History  . Occupation: Teacher, adult education:   Tobacco Use  . Smoking status: Never Smoker  . Smokeless tobacco: Never Used  Substance and Sexual Activity  . Alcohol use: Yes    Comment: occasionally  . Drug use: No  . Sexual activity: Yes    Partners: Male    Birth control/protection: None  Other Topics Concern  . Not on file  Social History Narrative  . Not on file   Social Determinants of Health   Financial Resource Strain:   . Difficulty of Paying Living Expenses: Not on file  Food Insecurity:   . Worried About Programme researcher, broadcasting/film/video in the Last Year: Not on file  . Ran Out of Food in the Last Year: Not on file  Transportation Needs:   . Lack of Transportation (Medical): Not on file  . Lack of Transportation (Non-Medical): Not on file  Physical Activity:   . Days of Exercise per Week: Not on file  . Minutes of Exercise per Session: Not on file  Stress:   . Feeling of Stress : Not on file  Social Connections:   . Frequency of Communication with Friends and Family: Not on file  . Frequency of Social Gatherings with Friends and Family: Not on file  . Attends Religious Services: Not on file  . Active Member of Clubs or Organizations: Not on file  . Attends Banker Meetings: Not on file  . Marital Status: Not on file     Family History: The patient's family history includes Dementia in her maternal grandmother; Depression in her paternal grandfather; Diabetes in her paternal grandmother; Heart attack in her maternal aunt; Hyperlipidemia in her father; Hypertension in her father and mother. There is no history of Colon cancer,  Esophageal cancer, Stomach cancer, Rectal cancer, or Breast cancer.  ROS:   Please see the history of present illness.     All other systems reviewed and are negative.  EKGs/Labs/Other Studies Reviewed:    The following studies were reviewed today:   EKG:  Nov. 6, 2020  NSR at 22.   Recent Labs: 06/19/2019: Hemoglobin 13.8; Platelets 256; TSH 1.840 10/16/2019: ALT 14; BUN 17; Creatinine, Ser 0.87; Potassium 4.0; Sodium 139  Recent  Lipid Panel    Component Value Date/Time   CHOL 235 (H) 10/16/2019 1133   TRIG 61 10/16/2019 1133   HDL 59 10/16/2019 1133   CHOLHDL 4.0 10/16/2019 1133   CHOLHDL 4.8 10/03/2011 1530   VLDL 20 10/03/2011 1530   LDLCALC 166 (H) 10/16/2019 1133    Physical Exam:      Physical Exam: Blood pressure 118/82, pulse 76, height 5\' 2"  (1.575 m), weight 198 lb 8 oz (90 kg), last menstrual period 09/27/2019, SpO2 97 %.  GEN:  Mildly obese female,  NAD  HEENT: Normal NECK: No JVD; No carotid bruits LYMPHATICS: No lymphadenopathy CARDIAC: RRR , no murmurs, rubs, gallops RESPIRATORY:  Clear to auscultation without rales, wheezing or rhonchi  ABDOMEN: Soft, non-tender, non-distended MUSCULOSKELETAL:  No edema; No deformity  SKIN: Warm and dry NEUROLOGIC:  Alert and oriented x 3   ASSESSMENT:    1. NSVT (nonsustained ventricular tachycardia) (HCC)   2. Hyperlipidemia, unspecified hyperlipidemia type    PLAN:    In order of problems listed above:  1. Nonsustained ventricular tachycardia: Ashley Chang  has been found to have nonsustained ventricular tachycardia.  She did not have a lot of premature ventricular contractions.  We tried Toprol-XL but she was not able to tolerate it.  Discussed the case with Dr. Eber Jones.  If her left ventricular function is still well preserved and if she does not have a lot of coronary calcium or other evidence of coronary artery disease, we will start her on flecainide 50 mg twice a day.  I will see her back in the office in 3  months for follow-up visit.  2.  Mitral regurgitation:   I will see her again in 3 months.  3.  Hyperlipidemia: Her last lipids are from 2013 and were fairly elevated.  She has been trying to work on diet, exercise, weight loss.  I have encouraged her to continue with diet, exercise, weight loss.  We will get a coronary calcium score.  I would have a low threshold to start a statin medication if she has a significant coronary calcium. See again in 3 months.  Medication Adjustments/Labs and Tests Ordered: Current medicines are reviewed at length with the patient today.  Concerns regarding medicines are outlined above.  Orders Placed This Encounter  Procedures  . CT CARDIAC SCORING  . Lipid panel  . Hepatic function panel  . Basic metabolic panel  . ECHOCARDIOGRAM COMPLETE   No orders of the defined types were placed in this encounter.   Patient Instructions  Medication Instructions:  Your provider recommends that you continue on your current medications as directed. Please refer to the Current Medication list given to you today.   *If you need a refill on your cardiac medications before your next appointment, please call your pharmacy*  Labs: TODAY: BMET, liver, lipids  Testing/Procedures: Your provider has requested that you have an echocardiogram. Echocardiography is a painless test that uses sound waves to create images of your heart. It provides your doctor with information about the size and shape of your heart and how well your heart's chambers and valves are working. This procedure takes approximately one hour. There are no restrictions for this procedure.    Dr. 2014 recommends you have a CALCIUM SCORE.  Follow-Up: You have a follow-up appointment in June with Dr. July.    Signed, Elease Hashimoto, MD  10/19/2019 6:34 AM    [facility]

## 2019-10-19 ENCOUNTER — Telehealth: Payer: Self-pay | Admitting: Nurse Practitioner

## 2019-10-19 DIAGNOSIS — I2584 Coronary atherosclerosis due to calcified coronary lesion: Secondary | ICD-10-CM

## 2019-10-19 DIAGNOSIS — I2510 Atherosclerotic heart disease of native coronary artery without angina pectoris: Secondary | ICD-10-CM

## 2019-10-19 DIAGNOSIS — I4729 Other ventricular tachycardia: Secondary | ICD-10-CM

## 2019-10-19 DIAGNOSIS — R002 Palpitations: Secondary | ICD-10-CM

## 2019-10-19 DIAGNOSIS — I472 Ventricular tachycardia: Secondary | ICD-10-CM

## 2019-10-19 NOTE — Telephone Encounter (Signed)
-----   Message from Vesta Mixer, MD sent at 10/18/2019  6:56 PM EST ----- Coronary calcium score of 16. This was 82 percentile for age and sex matched control.  LDL chol is 166.   Please start Rosuvastatin 20 mg a day.   Check lipids, liver enz, and bmp in 3 months. While her score is high for her age,  the overall score is low and she should be able to start Flecainide 50 mg BID She will need to return in 2-3 weeks for stress ( exercise)  myoview to assess the effect of increased HR with the Flecainide  She also had an echo - see separate result note

## 2019-10-19 NOTE — Telephone Encounter (Signed)
Reviewed results and plan of care with patient. She agrees to start rosuvastatin 20 mg once daily. She is going to check her work schedule and call me back to plan the start of flecainide due to the fact that she will need an exercise stress test 2-3 weeks after the initiation and a pre-screening covid test prior to that.

## 2019-10-22 NOTE — Telephone Encounter (Signed)
Patient returning a call to [facility] in regards to scheduling exercise test.

## 2019-10-26 MED ORDER — FLECAINIDE ACETATE 50 MG PO TABS: 50.0000 mg | ORAL_TABLET | Freq: Two times a day (BID) | ORAL | 11 refills | Status: AC

## 2019-10-26 NOTE — Telephone Encounter (Signed)
Spoke with patient who states she has had difficulty making arrangements to be off work to get stress test scheduled. She would like to have exercise myoview on Thursday April 22. 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 I advised her to start flecainide 50 mg BID on April 1 or 2 and not before. I advised her that I will send a message to our scheduler for her stress test and to call back prior to these dates with any questions or concerns. She verbalized understanding and agreement with plan and thanked me for the call.

## 2019-10-26 NOTE — Telephone Encounter (Signed)
Left message for patient to call back regarding scheduling of tests and medication start

## 2019-11-02 ENCOUNTER — Other Ambulatory Visit: Payer: Self-pay | Admitting: Obstetrics and Gynecology

## 2019-11-02 DIAGNOSIS — Z1231 Encounter for screening mammogram for malignant neoplasm of breast: Secondary | ICD-10-CM

## 2019-11-30 ENCOUNTER — Telehealth: Payer: Self-pay | Admitting: Cardiovascular Disease

## 2019-11-30 NOTE — Telephone Encounter (Signed)
Patient returning call. Transferred to the nurse. 

## 2019-11-30 NOTE — Telephone Encounter (Signed)
Spoke with patient who requests Covid test order be faxed to her employer so that she can complete testing in [place] prior to exercise myoview on Friday 4/23. I confirmed fax number and have faxed order per her request. I requested results be faxed back to our nuclear imaging department so they can review prior to her test on Friday. She thanked me for the call.

## 2019-12-01 ENCOUNTER — Telehealth (HOSPITAL_COMMUNITY): Payer: Self-pay | Admitting: *Deleted

## 2019-12-01 NOTE — Telephone Encounter (Signed)
Left message on voicemail per DPR in reference to upcoming appointment scheduled on 12/04/19 at 10:15 with detailed instructions given per Myocardial Perfusion Study Information Sheet for the test. LM to arrive 15 minutes early, and that it is imperative to arrive on time for appointment to keep from having the test rescheduled. If you need to cancel or reschedule your appointment, please call the office within 24 hours of your appointment. Failure to do so may result in a cancellation of your appointment, and a $50 no show fee. Phone number given for call back for any questions.

## 2019-12-03 ENCOUNTER — Ambulatory Visit
Admission: RE | Admit: 2019-12-03 | Discharge: 2019-12-03 | Disposition: A | Discharge status: Home / Self Care | Payer: 59 | Source: Ambulatory Visit | Attending: Obstetrics and Gynecology | Admitting: Obstetrics and Gynecology

## 2019-12-03 ENCOUNTER — Other Ambulatory Visit: Payer: Self-pay

## 2019-12-03 ENCOUNTER — Encounter (HOSPITAL_COMMUNITY): Payer: 59

## 2019-12-03 ENCOUNTER — Encounter (HOSPITAL_COMMUNITY): Payer: Self-pay

## 2019-12-03 DIAGNOSIS — Z1231 Encounter for screening mammogram for malignant neoplasm of breast: Secondary | ICD-10-CM

## 2019-12-04 ENCOUNTER — Ambulatory Visit (HOSPITAL_COMMUNITY): Payer: 59 | Attending: Cardiology

## 2019-12-04 DIAGNOSIS — I2584 Coronary atherosclerosis due to calcified coronary lesion: Secondary | ICD-10-CM | POA: Insufficient documentation

## 2019-12-04 DIAGNOSIS — I472 Ventricular tachycardia: Secondary | ICD-10-CM | POA: Diagnosis present

## 2019-12-04 DIAGNOSIS — I2510 Atherosclerotic heart disease of native coronary artery without angina pectoris: Secondary | ICD-10-CM | POA: Diagnosis present

## 2019-12-04 DIAGNOSIS — R002 Palpitations: Secondary | ICD-10-CM | POA: Diagnosis present

## 2019-12-04 DIAGNOSIS — I4729 Other ventricular tachycardia: Secondary | ICD-10-CM

## 2019-12-04 LAB — MYOCARDIAL PERFUSION IMAGING
Estimated workload: 12 METS
Exercise duration (min): 10 min
Exercise duration (sec): 11 s
LV dias vol: 113 mL (ref 46–106)
LV sys vol: 51 mL
MPHR: 179 {beats}/min
Peak HR: 179 {beats}/min
Percent HR: 100 %
Rest HR: 61 {beats}/min
SDS: 1
SRS: 0
SSS: 1
TID: 1.01

## 2019-12-04 MED ORDER — TECHNETIUM TC 99M TETROFOSMIN IV KIT
31.3000 | PACK | Freq: Once | INTRAVENOUS | Status: AC | PRN
  Administered 2019-12-04: 31.3 via INTRAVENOUS
  Filled 2019-12-04: qty 32

## 2019-12-04 MED ORDER — TECHNETIUM TC 99M TETROFOSMIN IV KIT
10.3000 | PACK | Freq: Once | INTRAVENOUS | Status: AC | PRN
  Administered 2019-12-04: 10.3 via INTRAVENOUS
  Filled 2019-12-04: qty 11

## 2019-12-07 ENCOUNTER — Other Ambulatory Visit: Payer: Self-pay | Admitting: Nurse Practitioner

## 2019-12-07 MED ORDER — ROSUVASTATIN CALCIUM 20 MG PO TABS: 20.0000 mg | ORAL_TABLET | Freq: Every day | ORAL | 3 refills | Status: DC

## 2020-01-07 ENCOUNTER — Other Ambulatory Visit: Payer: Self-pay

## 2020-01-13 ENCOUNTER — Telehealth: Payer: Self-pay | Admitting: Cardiovascular Disease

## 2020-01-13 NOTE — Telephone Encounter (Signed)
Pt c/o medication issue:  1. Name of Medication: flecainide (TAMBOCOR) 50 MG tablet  2. How are you currently taking this medication (dosage and times per day)? As directed   3. Are you having a reaction (difficulty breathing--STAT)? no  4. What is your medication issue? PT wanted to know if she needed to stay on this medication until her appointment on Monday.  She wanted to know if the medication would need to be stopped if Dr. Elease Hashimoto would be doing an EKG on Monday at her appointment

## 2020-01-13 NOTE — Telephone Encounter (Signed)
Spoke with Jinger to let her know to continue taking the Flecainide. She verbalized understanding and thanked me for the call.

## 2020-01-14 ENCOUNTER — Telehealth: Payer: Self-pay | Admitting: Cardiovascular Disease

## 2020-01-14 NOTE — Telephone Encounter (Signed)
Okey Regal from [facility] is calling requesting to speak with Eligha Bridegroom stating she is returning her call. Please advise.

## 2020-01-14 NOTE — Telephone Encounter (Signed)
Spoke with patient who called to ask if appointment scheduled for Monday could be postponed due to work circumstances. I discussed with Dr. Elease Hashimoto and he advised that she may postpone her appointment 1-2 months. Patient is rescheduled for July 2. She thanked me for my help.

## 2020-01-18 ENCOUNTER — Ambulatory Visit: Payer: 59 | Admitting: Cardiovascular Disease

## 2020-02-11 ENCOUNTER — Encounter: Payer: Self-pay | Admitting: Cardiovascular Disease

## 2020-02-11 NOTE — Progress Notes (Signed)
Cardiology Office Note:    Date:  02/12/2020   ID:  Ashley Chang, DOB 04/02/1976, MRN [id number]  PCP:  Ashley Chang, Ashley M, PA-C (Inactive)  Cardiologist:  Ashley Chang   Electrophysiologist:  None   Referring MD: Ashley Chang, Ashley M, PA-C   Chief Complaint  Patient presents with  . Palpitations    History of Present Illness:    Ashley Chang is a 44 y.o. female with a hx of palpitation She is a PACU nurse She had palpitations while at work .  Co-workers placed her on a monitor.  Found bigeminy Has palpitations every other day  No caffiene.   Perhaps 1 green tea a week  No tea, no coffee, no soft drinks  Has palpitations QOD , Not related to menstral cycle   Worse wihen she is sleep deprived.  [place] to [place]  Raises and shows Quarter Horses  Reviewed meds.   No weight gain , loss, no diarrhea, no hair loss,  Exercises regularly  Owns a farm and 7 horses,  Runs on occasion   Now works at [employer],( formerly was at [employer] )   October 16, 2019:  She found out she is allergic to the adhesive on the monitor  She was found to have an episode of nonsustained ventricular tachycardia-11 beats.  We tried her on Toprol-XL 25 mg a day but this caused profound lethargy.  She also had evidence of sinus bradycardia on the monitor.  She was only able to tolerate the Toprol for 3 days and then had to stop.  She is feeling better since that time.  She is continued to have episodes of palpitations-once or twice a week which last for a few seconds or so.  Denies any syncope.  We do not have a current assessment of her LV function.  Her echocardiogram from September, 2014 reveals normal left ventricular systolic function with an ejection fraction of 50 to 55%.  She has mild mitral regurgitation.  February 11, 2021: Ashley JonesCarolyn is seen today for follow-up visit.  She has a history of supraventricular tachycardia.  We tried her on Toprol-XL in the recent past but this  caused profound lethargy.  She was tried on flecainide. Her palpitations have improved significantly.  Can tell if she forgets to take the flecainide .  She had a stress Myoview study.  She exercised for 10 minutes and 11 seconds.  She achieved a peak heart rate of 179.  The Myoview studies were negative for ischemia.  She wants to try topamax for migraines which might also help with weight loss   Past Medical History:  Diagnosis Date  . Acute cystitis 04/07/2009   Qualifier: Diagnosis of  By: Thomos LemonsBowen DO, Karen    . Acute frontal sinusitis 03/12/2008   Qualifier: Diagnosis of  By: Andrey CampanileWilson MD, Raliegh IpLauraLee    . Anxiety   . Anxiety 04/24/2011  . Asthma with acute exacerbation 08/19/2008   Qualifier: Diagnosis of  By: Andrey CampanileWilson MD, Raliegh IpLauraLee     . Bronchitis 07/24/2012  . Chicken pox 44 yrs old  . Depression with anxiety 04/24/2011  . Epilepsy (HCC)    medically cleared and not followed by a physician for 16 yrs  . Episodic tension type headache 10/25/2008   Qualifier: Diagnosis of  By: Andrey CampanileWilson MD, Raliegh IpLauraLee    . Fifth disease infant  . Fluid retention 04/06/2013  . FRACTURE, COCCYX 05/27/2008   Qualifier: Diagnosis of  By: Andrey CampanileWilson MD, Raliegh IpLauraLee    .  Functional dyspepsia 09/12/2011  . Hx of tympanostomy tubes   . Hyperlipidemia 04/24/2011  . Insomnia 04/18/2012  . Liver lesion 08/27/2013  . Migraine headache without aura 04/06/2013  . Newly recognized heart murmur 09/12/2011  . Overweight(278.02) 05/21/2011  . Palpitations 04/07/2009   Qualifier: Diagnosis of  By: Thomos Lemons    . Portal vein thrombosis 10/14/2013  . Reflux 04/18/2012  . RMSF [facility] spotted fever) 04/24/2011   H/o 2007   . RUQ pain 09/20/2012    Past Surgical History:  Procedure Laterality Date  . ANKLE SURGERY Right 02/2010   removal of tissue and reconstruction  . LIVER SURGERY    . TONSILLECTOMY  1996    Current Medications: Current Meds  Medication Sig  . albuterol (PROVENTIL HFA;VENTOLIN HFA) 108 (90 BASE) MCG/ACT  inhaler Inhale 2 puffs into the lungs every 6 (six) hours as needed for wheezing or shortness of breath.  . ALPRAZolam (XANAX) 0.5 MG tablet Take 0.5 mg by mouth at bedtime as needed for anxiety or sleep.  . Artificial Tear Ointment (ARTIFICIAL TEARS) ointment Place 1 drop into both eyes 3 (three) times daily as needed (dry eyes).   . flecainide (TAMBOCOR) 50 MG tablet Take 1 tablet (50 mg total) by mouth 2 (two) times daily.  . methocarbamol (ROBAXIN) 500 MG tablet Take 500 mg by mouth every 8 (eight) hours as needed for muscle spasms.  . Multiple Vitamin (MULTIVITAMIN) tablet Take 1 tablet by mouth daily.    . promethazine (PHENERGAN) 25 MG tablet Take 1 tablet (25 mg total) by mouth every 6 (six) hours as needed for nausea or vomiting.  . rosuvastatin (CRESTOR) 20 MG tablet Take 1 tablet (20 mg total) by mouth daily.     Allergies:   Rizatriptan and Latex   Social History   Socioeconomic History  . Marital status: Married    Spouse name: Not on file  . Number of children: 1  . Years of education: Not on file  . Highest education level: Not on file  Occupational History  . Occupation: Teacher, adult education:   Tobacco Use  . Smoking status: Never Smoker  . Smokeless tobacco: Never Used  Substance and Sexual Activity  . Alcohol use: Yes    Comment: occasionally  . Drug use: No  . Sexual activity: Yes    Partners: Male    Birth control/protection: None  Other Topics Concern  . Not on file  Social History Narrative  . Not on file   Social Determinants of Health   Financial Resource Strain:   . Difficulty of Paying Living Expenses:   Food Insecurity:   . Worried About Programme researcher, broadcasting/film/video in the Last Year:   . Barista in the Last Year:   Transportation Needs:   . Freight forwarder (Medical):   Marland Kitchen Lack of Transportation (Non-Medical):   Physical Activity:   . Days of Exercise per Week:   . Minutes of Exercise per Session:   Stress:   . Feeling of Stress  :   Social Connections:   . Frequency of Communication with Friends and Family:   . Frequency of Social Gatherings with Friends and Family:   . Attends Religious Services:   . Active Member of Clubs or Organizations:   . Attends Banker Meetings:   Marland Kitchen Marital Status:      Family History: The patient's family history includes Dementia in her maternal grandmother; Depression in  her paternal grandfather; Diabetes in her paternal grandmother; Heart attack in her maternal aunt; Hyperlipidemia in her father; Hypertension in her father and mother. There is no history of Colon cancer, Esophageal cancer, Stomach cancer, Rectal cancer, or Breast cancer.  ROS:   Please see the history of present illness.     All other systems reviewed and are negative.  EKGs/Labs/Other Studies Reviewed:    The following studies were reviewed today:   EKG: February 12, 2020: Normal sinus rhythm at 60.  QRS duration is 94 ms.  EKG shows no ST or T wave abnormalities.  Recent Labs: 06/19/2019: Hemoglobin 13.8; Platelets 256; TSH 1.840 10/16/2019: ALT 14; BUN 17; Creatinine, Ser 0.87; Potassium 4.0; Sodium 139  Recent Lipid Panel    Component Value Date/Time   CHOL 235 (H) 10/16/2019 1133   TRIG 61 10/16/2019 1133   HDL 59 10/16/2019 1133   CHOLHDL 4.0 10/16/2019 1133   CHOLHDL 4.8 10/03/2011 1530   VLDL 20 10/03/2011 1530   LDLCALC 166 (H) 10/16/2019 1133    Physical Exam:     Physical Exam: Blood pressure 114/90, pulse 60, height 5\' 2"  (1.575 Chang), weight 204 lb (92.5 kg), SpO2 99 %.  GEN:  Well nourished, well developed in no acute distress HEENT: Normal NECK: No JVD; No carotid bruits LYMPHATICS: No lymphadenopathy CARDIAC: RRR , no murmurs, rubs, gallops RESPIRATORY:  Clear to auscultation without rales, wheezing or rhonchi  ABDOMEN: Soft, non-tender, non-distended MUSCULOSKELETAL:  No edema; No deformity  SKIN: Warm and dry NEUROLOGIC:  Alert and oriented x 3    ASSESSMENT:    1.  NSVT (nonsustained ventricular tachycardia) (HCC)   2. Coronary artery calcification   3. Palpitations   4. Hyperlipidemia, unspecified hyperlipidemia type    PLAN:    In order of problems listed above:  1. Nonsustained ventricular tachycardia: Ashley Chang  has been found to have nonsustained ventricular tachycardia.  She did not have a lot of premature ventricular contractions.  Her symptoms are clearly better on flecainide.  On the rare occasion when she forgets to take her flecainide she notices a definite increase in her episodes of palpitations and nonsustained ventricular tachycardia.  Her stress Myoview study was normal.  She has great exercise capacity and there was no signs of QRS widening at peak exercise.  We will continue with her current medications.  Her labs have been stable.  I will see her again in 6 months. .  2.  Mitral regurgitation:   Stable   3.  Hyperlipidemia:  Cont to work on Ashley Chang management   Medication Adjustments/Labs and Tests Ordered: Current medicines are reviewed at length with the patient today.  Concerns regarding medicines are outlined above.  Orders Placed This Encounter  Procedures  . EKG 12-Lead   No orders of the defined types were placed in this encounter.   Patient Instructions  Medication Instructions:  Your physician recommends that you continue on your current medications as directed. Please refer to the Current Medication list given to you today.  *If you need a refill on your cardiac medications before your next appointment, please call your pharmacy*   Lab Work: None Ordered   Testing/Procedures: None Ordered   Follow-Up: At [facility], you and your health needs are our priority.  As part of our continuing mission to provide you with exceptional heart care, we have created designated Provider Care Teams.  These Care Teams include your primary Cardiologist (physician) and Advanced Practice Providers (APPs -  Physician  Assistants and Nurse Practitioners) who all work together to provide you with the care you need, when you need it.   Your next appointment:   6 month(s)  The format for your next appointment:   In Person  Provider:   You may see Kristeen Miss, MD or one of the following Advanced Practice Providers on your designated Care Team:    Tereso Newcomer, PA-C  Chelsea Aus, [place]        Signed, Kristeen Miss, MD  02/12/2020 11:33 AM    [facility]

## 2020-02-12 ENCOUNTER — Ambulatory Visit (INDEPENDENT_AMBULATORY_CARE_PROVIDER_SITE_OTHER): Payer: 59 | Admitting: Cardiovascular Disease

## 2020-02-12 ENCOUNTER — Other Ambulatory Visit: Payer: Self-pay

## 2020-02-12 ENCOUNTER — Encounter: Payer: Self-pay | Admitting: Cardiovascular Disease

## 2020-02-12 VITALS — BP 114/90 | HR 60 | Ht 62.0 in | Wt 204.0 lb

## 2020-02-12 DIAGNOSIS — I2510 Atherosclerotic heart disease of native coronary artery without angina pectoris: Secondary | ICD-10-CM

## 2020-02-12 DIAGNOSIS — E785 Hyperlipidemia, unspecified: Secondary | ICD-10-CM

## 2020-02-12 DIAGNOSIS — I2584 Coronary atherosclerosis due to calcified coronary lesion: Secondary | ICD-10-CM

## 2020-02-12 DIAGNOSIS — R002 Palpitations: Secondary | ICD-10-CM | POA: Diagnosis not present

## 2020-02-12 DIAGNOSIS — I4729 Other ventricular tachycardia: Secondary | ICD-10-CM

## 2020-02-12 DIAGNOSIS — I472 Ventricular tachycardia: Secondary | ICD-10-CM

## 2020-02-12 NOTE — Patient Instructions (Signed)
Medication Instructions:  Your physician recommends that you continue on your current medications as directed. Please refer to the Current Medication list given to you today.  *If you need a refill on your cardiac medications before your next appointment, please call your pharmacy*   Lab Work: None Ordered   Testing/Procedures: None Ordered   Follow-Up: At [facility] [employer], you and your health needs are our priority.  As part of our continuing mission to provide you with exceptional heart care, we have created designated Provider Care Teams.  These Care Teams include your primary Cardiologist (physician) and Advanced Practice Providers (APPs -  Physician Assistants and Nurse Practitioners) who all work together to provide you with the care you need, when you need it.   Your next appointment:   6 month(s)  The format for your next appointment:   In Person  Provider:   You may see Kristeen Miss, MD or one of the following Advanced Practice Providers on your designated Care Team:    Tereso Newcomer, PA-C  Vin Law[place], [place]

## 2020-03-18 ENCOUNTER — Encounter: Payer: Self-pay | Admitting: Cardiovascular Disease

## 2020-03-18 ENCOUNTER — Telehealth: Payer: Self-pay | Admitting: *Deleted

## 2020-03-18 NOTE — Telephone Encounter (Signed)
Our office received a fax from [facility] for medical clearance for exercise, though name or dob of the pt was listed on form. I called [facility] and s/w [place]. Horris Latino said she would look into who the pt was and call back with information. Horris Latino called and gave pt name of Ashley Chang, DOB [birth year]. This is not an actual surgery clearance form though I will still fill out and send to pre op as they are asking for medical clearance for exercise. [facility] is a health facility ([facility]).      [facility] Pre-operative Risk Assessment    HEARTCARE STAFF: - Please ensure there is not already an duplicate clearance open for this procedure. - Under Visit Info/Reason for Call, type in Other and utilize the format Clearance MM/DD/YY or Clearance TBD. Do not use dashes or single digits. - If request is for dental extraction, please clarify the # of teeth to be extracted.  Request for surgical clearance: SEE PHONE NOTE; I WILL HAVE THE REQUEST FROM [facility] SCANNED INTO THE PT'S CHART  1. What type of surgery is being performed? THIS IS A CLEARANCE FORM FOR CLEARANCE OK FOR PT TO EXERCISE   2. When is this surgery scheduled? N/A   3. What type of clearance is required (medical clearance vs. Pharmacy clearance to hold med vs. Both)? MEDICAL  4. Are there any medications that need to be held prior to surgery and how long? NO MEDICATIONS ARE NEEDING TO BE HELD   5. Practice name and name of physician performing surgery? [facility] DE[facility] ATTN: JENNIFER KLEMM   6. What is the office phone number? [phone]   7.   What is the office fax number? [phone]  8.   Anesthesia type (None, local, MAC, general) ? N/A   Julaine Hua 03/18/2020, 2:22 PM  _________________________________________________________________   (provider comments below)

## 2020-03-18 NOTE — Telephone Encounter (Signed)
error 

## 2020-03-18 NOTE — Telephone Encounter (Signed)
   Primary Cardiologist: Kristeen Miss, MD  Chart reviewed as part of pre-operative protocol coverage. Given past medical history and time since last visit, based on ACC/AHA guidelines, Ashley Chang would be at acceptable risk for participating in exercise without further cardiovascular testing.   I will route this recommendation to the requesting party via Epic fax function and remove from pre-op pool.  Please call with questions.  Thomasene Ripple. Azani Brogdon NP-C    03/18/2020, 2:40 PM [facility] [address] Office [phone] Fax [phone]

## 2020-06-06 ENCOUNTER — Telehealth: Payer: Self-pay

## 2020-06-06 NOTE — Telephone Encounter (Signed)
Patient called requesting testing for the Covid Vaccine & Flu Shot. Patient states she needs it for work. She states she has had a severe reaction to the Flu Shot and the H1N1 Vaccine.  Please review before scheduling the patient.  Thanks

## 2020-06-06 NOTE — Telephone Encounter (Signed)
Flu shot- swelling in arm, gets bigger every year, asthma flare from the vaccine.  H1n1-whole arm swelling (2014)  She is not allergic to egg, no other vaccine reactions she is aware of, allergic to maxalt and latex. Never had a colonoscopy, she has taken miralax, and had no issues. Not allergic to antibiotics, not allergic to steroids. Friday August 13th 2021.

## 2020-06-07 NOTE — Telephone Encounter (Signed)
Patient was advised and has been scheduled for a new patient appointment.

## 2020-06-07 NOTE — Telephone Encounter (Signed)
We can do flu shot testing and then give in our office in split dosing if interested.  Reactions to the flu shot is not a contraindication for getting the covid-19 vaccine. Based on the history, most likely does not even need covid-19 component testing.   She isn't not a patient at our office - recommend that she schedules new patient appointment to discuss this.  Thank you.

## 2020-07-12 NOTE — Progress Notes (Signed)
Virtual Visit via Telephone Note   This visit type was conducted due to national recommendations for restrictions regarding the COVID-19 Pandemic (e.g. social distancing) in an effort to limit this patient's exposure and mitigate transmission in our community.  Due to her co-morbid illnesses, this patient is at least at moderate risk for complications without adequate follow up.  This format is felt to be most appropriate for this patient at this time.  The patient did not have access to video technology/had technical difficulties with video requiring transitioning to audio format only (telephone).  All issues noted in this document were discussed and addressed.  No physical exam could be performed with this format.  Please refer to the patient's chart for her  consent to telehealth for [facility].    Date:  07/13/2020   ID:  Ashley Chang, DOB 12/03/1975, MRN [id number] The patient was identified using 2 identifiers.  Patient Location: Home Provider Location: Home Office  PCP:  Shirlyn Goltz (Inactive)  Cardiologist:  Kristeen Miss, MD  Electrophysiologist:  None   Evaluation Performed:  Follow-Up Visit  Chief Complaint:  Follow-up (Palpitations)    Patient Profile: Ashley Chang is a 44 y.o. female with:  Coronary Ca2+  CT 3/21: Ca2+ score 16  Echocardiogram 3/21: EF 60-65   Palpitations   Ventricular bigeminy in the past    11 beats NSVT on monitor in the past  Intol of Metoprolol succinate   Hx of SVT  Flecainide Rx   Hyperlipidemia   Mitral regurgitation   Mild by Echocardiogram in 3/21   Asthma   Prior CV Studies: Exercise Myoview 12/04/2019 Ex 10'; 12 METs; no exercise induced VT // EF 55, normal perfusion, low risk  CT Cardiac Scoring 10/16/19 IMPRESSION: Coronary calcium score of 16. This was 32 percentile for age and sex matched control.  Echocardiogram 10/16/19 EF 60-65, no RWMA, normal RVSF, mild MR  Event monitor  07/2019 NSR, occ sinus tachy; 1 episode of NSVT (11 beats)  History of Present Illness:   Ms. Ashley Chang was last seen by Dr. Elease Hashimoto in July 2021.  She is seen for follow-up.  Since last seen, she has done well.  Her palpitations remain well controlled on flecainide.  She does have some breakthrough palpitations when she is under a lot of stress or has not slept for quite some time.  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  She will definitely have symptoms at the end of these shifts.  She has not had chest discomfort, shortness of breath, syncope or near syncope.  Past Medical History:  Diagnosis Date   Acute cystitis 04/07/2009   Qualifier: Diagnosis of  By: Cathey Endow DO, Karen     Acute frontal sinusitis 03/12/2008   Qualifier: Diagnosis of  By: Andrey Campanile MD, LauraLee     Anxiety    Anxiety 04/24/2011   Asthma with acute exacerbation 08/19/2008   Qualifier: Diagnosis of  By: Andrey Campanile MD, LauraLee      Bronchitis 07/24/2012   Chicken pox 44 yrs old   Depression with anxiety 04/24/2011   Epilepsy (HCC)    medically cleared and not followed by a physician for 16 yrs   Episodic tension type headache 10/25/2008   Qualifier: Diagnosis of  By: Andrey Campanile MD, LauraLee     Fifth disease infant   Fluid retention 04/06/2013   FRACTURE, COCCYX 05/27/2008   Qualifier: Diagnosis of  By: Andrey Campanile MD, Raliegh Ip     Functional dyspepsia  09/12/2011   Hx of tympanostomy tubes    Hyperlipidemia 04/24/2011   Insomnia 04/18/2012   Liver lesion 08/27/2013   Migraine headache without aura 04/06/2013   Newly recognized heart murmur 09/12/2011   Overweight(278.02) 05/21/2011   Palpitations 04/07/2009   Qualifier: Diagnosis of  By: Cathey Endow DO, Karen     Portal vein thrombosis 10/14/2013   Reflux 04/18/2012   RMSF [facility] spotted fever) 04/24/2011   H/o 2007    RUQ pain 09/20/2012   Past Surgical History:  Procedure Laterality Date   ANKLE SURGERY Right 02/2010   removal of tissue and  reconstruction   LIVER SURGERY     TONSILLECTOMY  1996     Current Meds  Medication Sig   albuterol (PROVENTIL HFA;VENTOLIN HFA) 108 (90 BASE) MCG/ACT inhaler Inhale 2 puffs into the lungs every 6 (six) hours as needed for wheezing or shortness of breath.   ALPRAZolam (XANAX) 0.5 MG tablet Take 0.5 mg by mouth at bedtime as needed for anxiety or sleep.   Artificial Tear Ointment (ARTIFICIAL TEARS) ointment Place 1 drop into both eyes 3 (three) times daily as needed (dry eyes).    flecainide (TAMBOCOR) 50 MG tablet Take 1 tablet (50 mg total) by mouth 2 (two) times daily.   methocarbamol (ROBAXIN) 500 MG tablet Take 500 mg by mouth every 8 (eight) hours as needed for muscle spasms.   Multiple Vitamin (MULTIVITAMIN) tablet Take 1 tablet by mouth daily.     promethazine (PHENERGAN) 25 MG tablet Take 1 tablet (25 mg total) by mouth every 6 (six) hours as needed for nausea or vomiting.   rosuvastatin (CRESTOR) 20 MG tablet Take 1 tablet (20 mg total) by mouth daily.     Allergies:   Rizatriptan and Latex   Social History   Tobacco Use   Smoking status: Never Smoker   Smokeless tobacco: Never Used  Substance Use Topics   Alcohol use: Yes    Comment: occasionally   Drug use: No     Family Hx: The patient's family history includes Dementia in her maternal grandmother; Depression in her paternal grandfather; Diabetes in her paternal grandmother; Heart attack in her maternal aunt; Hyperlipidemia in her father; Hypertension in her father and mother. There is no history of Colon cancer, Esophageal cancer, Stomach cancer, Rectal cancer, or Breast cancer.  ROS:   Please see the history of present illness.   Labs/Other Tests and Data Reviewed:    EKG:  No ECG reviewed.  Recent Labs: 10/16/2019: ALT 14; BUN 17; Creatinine, Ser 0.87; Potassium 4.0; Sodium 139   Recent Lipid Panel Lab Results  Component Value Date/Time   CHOL 235 (H) 10/16/2019 11:33 AM   TRIG 61 10/16/2019  11:33 AM   HDL 59 10/16/2019 11:33 AM   CHOLHDL 4.0 10/16/2019 11:33 AM   CHOLHDL 4.8 10/03/2011 03:30 PM   LDLCALC 166 (H) 10/16/2019 11:33 AM    Wt Readings from Last 3 Encounters:  07/13/20 204 lb (92.5 kg)  02/12/20 204 lb (92.5 kg)  12/04/19 198 lb (89.8 kg)     Risk Assessment/Calculations:      Objective:    Vital Signs:  BP 123/77    Pulse 74    Ht 5\' 2"  (1.575 m)    Wt 204 lb (92.5 kg)    SpO2 98%    BMI 37.31 kg/m    VITAL SIGNS:  reviewed GEN:  no acute distress PSYCH:  normal affect  ASSESSMENT & PLAN:    1.  Palpitations Overall well controlled on low-dose flecainide.  We discussed the possibility of taking an as needed short acting form of diltiazem.  However, she feels that her palpitations are controlled enough that she does not need this.  Continue current therapy.  Plan follow-up again in 6 months in person with an EKG.  2. Coronary artery calcification Nuclear stress test in April 2021 - for ischemia.  She is not having anginal symptoms.  Continue rosuvastatin.  3. Mixed hyperlipidemia Continue rosuvastatin.  Arrange fasting CMET, lipids.    Time:   Today, I have spent 6 minutes with the patient with telehealth technology discussing the above problems.     Medication Adjustments/Labs and Tests Ordered: Current medicines are reviewed at length with the patient today.  Concerns regarding medicines are outlined above.   Tests Ordered: Orders Placed This Encounter  Procedures   Comprehensive metabolic panel   Lipid panel    Medication Changes: No orders of the defined types were placed in this encounter.   Follow Up:  In Person in 6 month(s) with an EKG  Signed, Tereso Newcomer, PA-C  07/13/2020 4:36 PM    [facility]

## 2020-07-13 ENCOUNTER — Encounter: Payer: Self-pay | Admitting: Physician Assistant

## 2020-07-13 ENCOUNTER — Other Ambulatory Visit: Payer: Self-pay

## 2020-07-13 ENCOUNTER — Telehealth: Payer: Self-pay | Admitting: *Deleted

## 2020-07-13 ENCOUNTER — Telehealth (INDEPENDENT_AMBULATORY_CARE_PROVIDER_SITE_OTHER): Payer: 59 | Admitting: Physician Assistant

## 2020-07-13 VITALS — BP 123/77 | HR 74 | Ht 62.0 in | Wt 204.0 lb

## 2020-07-13 DIAGNOSIS — E782 Mixed hyperlipidemia: Secondary | ICD-10-CM

## 2020-07-13 DIAGNOSIS — R002 Palpitations: Secondary | ICD-10-CM

## 2020-07-13 DIAGNOSIS — I2510 Atherosclerotic heart disease of native coronary artery without angina pectoris: Secondary | ICD-10-CM

## 2020-07-13 DIAGNOSIS — I2584 Coronary atherosclerosis due to calcified coronary lesion: Secondary | ICD-10-CM | POA: Diagnosis not present

## 2020-07-13 NOTE — Progress Notes (Signed)
New Patient Note  RE: Ashley Chang MRN: [id number] DOB: 1975-08-22 Date of Office Visit: 07/14/2020  Referring provider: No ref. provider found Primary care provider: Roseanna Rainbow, PA-C (Inactive)  Chief Complaint: Advice Only  History of Present Illness: I had the pleasure of seeing Ashley Chang for initial evaluation at the [facility] on 07/14/2020. She is a 44 y.o. female, who is self-referred here for the evaluation of concerns regarding COVID-19 vaccine.  Any known reactions to polyethylene glycol or polysorbate?  Not aware of any previous exposures/reactions.   Any history of anaphylaxis to vaccinations? Patient had large localized reactions after flu vaccines in the past. Usually the swelling started after a few hours and would swell from her shoulder to her hand. One episode required that her rings be cut off due to the swelling. This would resolve after 1 week with benadryl/tylenol/ibuprofen. Sometimes had associated itching. Her last flu vaccine was in 2014 and has an exemption letter for this.   Patient is not allergic to eggs and did not try to get split dosing of the flu vaccine.  No recent other vaccinations. Gets pain with Tdap.   Any history of reactions to injectable medications? No prior steroid or antibiotics injections.   Any history of anaphylaxis to colonoscopy preps (i.e.Miralax)? No prior colonoscopy. No issues with Colace but no prior Miralax exposure.   Any history of dermal filler treatments in the last year? No.  Patient had COVID-19 in August 13th and was hospitalized due to hypoxemia. No MAB infusion.   History of epilepsy with no known triggers.   Assessment and Plan: Lachlan is a 44 y.o. female with: Adverse reaction to viral vaccines, subsequent encounter Patient has history of large localized swelling with flu vaccines to the point of having to cut off her ring. Some pain with Tdap. Has exemption letter for  flu vaccines. Denies any other medication allergies. Tolerates eggs with no issues. History of epilepsy with no known triggers and blood clots .  Discussed with patient that given clinical history, she can receive either Pfizer or Moderna MRNA vaccine and component testing is not necessary.  Patient prefers to get component testing and results showed:   Today's skin prick and intradermal testing was negative to Miralax (source of PEG 3350), methylprednisolone acetate (source of PEG 3350), and triamcinolone acetonide (source of polysorbate-80).  Tolerated oral Miralax challenge with no issues.   Based on the above test results, patient does not meet criteria for medical exemption of the COVID-19 vaccine from allergy standpoint.   Give clinical history, recommend MRNA based vaccine with either Pfizer or Moderna.   Reactions to flu vaccine or Tdap is NOT a contraindication to receive the MRNA based vaccines.  She most likely had a robust immune response to these vaccines in the past and not an allergic reaction.   Offered to administer COVID-19 vaccine in our clinic and patient will call back to schedule. Wait 30 minutes in the clinic after administration.   Advised patient to follow up with neurologist regarding her concerns about the vaccine and her epilepsy history.   Mild intermittent asthma without complication Uses albuterol once per week when she's around her horses/dusty areas with good benefit.  Today's spirometry was normal today.  May use albuterol rescue inhaler 2 puffs every 4 to 6 hours as needed for shortness of breath, chest tightness, coughing, and wheezing. Monitor frequency of use.   Return if symptoms worsen or fail to  improve.  Other allergy screening: Asthma: yes  Uses albuterol about once per week when she's around horses.   Rhino conjunctivitis: no Food allergy: raw tomatoes cause chest flushing.  Medication allergy: yes  Latex -rash Hymenoptera allergy:  large localized reactions.  Urticaria: no Eczema:no History of recurrent infections suggestive of immunodeficency: no  Diagnostics: Spirometry:  Tracings reviewed. Her effort: Good reproducible efforts. FVC: 2.79L FEV1: 2.55L, 92% predicted FEV1/FVC ratio: 91% Interpretation: Spirometry consistent with normal pattern.  Please see scanned spirometry results for details.  Skin Testing: Today's skin prick and intradermal testing was negative to Miralax (source of PEG 3350), methylprednisolone acetate (source of PEG 3350), and triamcinolone acetonide (source of polysorbate-80).  Results discussed with patient/family.  COVID Vaccine Testing - 07/14/20 1032      Test Information   Consent Yes    Medications Miralax    Triamcinolone Lot # [id number]    Triamcinolone EXP DATE 07/12/21    Methylprednisolone Lot # [id number]    Methylprednisolone EXP DATE 11/09/20    Miralax Lot # [id number]    Miralax EXP DATE 01/09/22      Pre Test Vitals   BP 130/80    Pulse 73    Resp 17      SKIN PRICK TESTING - Arm #1   Location Right Arm    Select Select      HISTAMINE (1mg /mL) Skin Prick Arm #1   Histamine Time Testing Placed 1015    Histamine Wheal 2+      Control (negative - HSA) Skin Prick Arm #1   Control Wheal Negative      Triamcinolone (40mg /mL) Skin Prick Arm #1   Triamcinolone Wheal Negative      Methylprednisolone (40mg /mL) Skin Prick Arm #1   Methylprednisolone Wheal Negative      Miralax (1:100 or 1.7 mg/mL) Skin Prick Arm #1   Miralax Wheal Negative      Miralax (1:10 or 17mg /mL) Skin Prick Arm #1   Miralax Time Testing Placed 1037    Miralax Wheal Negative      Miralax (1:1 or 170mg /mL) Skin Prick Arm #1   Miralax Time Testing Placed 1059    Miralax Wheal Negative      INTRADERMAL TESTING - Arm #2   Location Left Arm    Select Select      Control (negative - HSA) Intradermal Arm #2   Control Time Testing Placed  1037    Control Wheal Negative       Triamcinolone (1:100) Intradermal Arm #2   Triamcinolone Wheal Negative      Methylprednisolone (1:100) Intradermal Arm #2   Methylprednisolone Wheal Negative      Triamcinolone (1:10) Intradermal Arm #2   Triamcinolone Time Testing Placed 1059    Triamcinolone Wheal Negative      Methylprednisolone (1:10) Intradermal Arm #2   Methylprednisolone Wheal Negative      Triamcinolone (1:1) Intradermal Arm #2   Triamcinolone Time Testing Placed 1119    Triamcinolone Wheal Negative      Skin Prick/Intradermal Post Testing   Skin Prick/Intradermal Testing Total Pricks 13      ORAL CHALLENGE TESTING   Select Select      Miralax 170mg /mL Suspension Oral Challenge 0.3 mL   Miralax 0.3 mL Time Given 1137           Past Medical History: Patient Active Problem List   Diagnosis Date Noted  . Mild intermittent asthma without complication 07/14/2020  . Adverse reaction to viral vaccines,  subsequent encounter 07/14/2020  . Coronary artery calcification 10/19/2019  . Portal vein thrombosis 10/14/2013  . RUQ abdominal pain 08/27/2013  . Liver lesion 08/27/2013  . Migraine headache without aura 04/06/2013  . Fluid retention 04/06/2013  . RUQ pain 09/20/2012  . Bronchitis 07/24/2012  . Reflux 04/18/2012  . Insomnia 04/18/2012  . Newly recognized heart murmur 09/12/2011  . Overweight(278.02) 05/21/2011  . Hyperlipidemia 04/24/2011  . Depression with anxiety 04/24/2011  . RMSF [facility]([place] spotted fever) 04/24/2011  . Epilepsy (HCC)   . Fifth disease   . Chicken pox   . Hx of tympanostomy tubes   . Palpitations 04/07/2009  . EPISODIC TENSION TYPE HEADACHE 10/25/2008  . ALLERGIC RHINITIS 09/08/2008  . Asthma with acute exacerbation 08/19/2008  . FRACTURE, COCCYX 05/27/2008   Past Medical History:  Diagnosis Date  . Acute cystitis 04/07/2009   Qualifier: Diagnosis of  By: Thomos LemonsBowen DO, Karen    . Acute frontal sinusitis 03/12/2008   Qualifier: Diagnosis of  By: Andrey CampanileWilson MD, Raliegh IpLauraLee     . Anxiety   . Anxiety 04/24/2011  . Asthma with acute exacerbation 08/19/2008   Qualifier: Diagnosis of  By: Andrey CampanileWilson MD, Raliegh IpLauraLee     . Bronchitis 07/24/2012  . Chicken pox 44 yrs old  . Depression with anxiety 04/24/2011  . Epilepsy (HCC)    medically cleared and not followed by a physician for 16 yrs  . Episodic tension type headache 10/25/2008   Qualifier: Diagnosis of  By: Andrey CampanileWilson MD, Raliegh IpLauraLee    . Fifth disease infant  . Fluid retention 04/06/2013  . FRACTURE, COCCYX 05/27/2008   Qualifier: Diagnosis of  By: Andrey CampanileWilson MD, Raliegh IpLauraLee    . Functional dyspepsia 09/12/2011  . Hx of tympanostomy tubes   . Hyperlipidemia 04/24/2011  . Insomnia 04/18/2012  . Liver lesion 08/27/2013  . Migraine headache without aura 04/06/2013  . Newly recognized heart murmur 09/12/2011  . Overweight(278.02) 05/21/2011  . Palpitations 04/07/2009   Qualifier: Diagnosis of  By: Thomos LemonsBowen DO, Karen    . Portal vein thrombosis 10/14/2013  . Reflux 04/18/2012  . RMSF [facility]([place] spotted fever) 04/24/2011   H/o 2007   . RUQ pain 09/20/2012   Past Surgical History: Past Surgical History:  Procedure Laterality Date  . ANKLE SURGERY Right 02/2010   removal of tissue and reconstruction  . DILATION AND CURETTAGE OF UTERUS    . LIVER SURGERY    . MYRINGOTOMY    . TONSILLECTOMY  1996   Medication List:  Current Outpatient Medications  Medication Sig Dispense Refill  . albuterol (PROVENTIL HFA;VENTOLIN HFA) 108 (90 BASE) MCG/ACT inhaler Inhale 2 puffs into the lungs every 6 (six) hours as needed for wheezing or shortness of breath.    . ALPRAZolam (XANAX) 0.5 MG tablet Take 0.5 mg by mouth at bedtime as needed for anxiety or sleep.    . Artificial Tear Ointment (ARTIFICIAL TEARS) ointment Place 1 drop into both eyes 3 (three) times daily as needed (dry eyes).     . flecainide (TAMBOCOR) 50 MG tablet Take 1 tablet (50 mg total) by mouth 2 (two) times daily. 60 tablet 11  . ibuprofen (ADVIL) 800 MG tablet Take 800 mg by mouth 3  (three) times daily.    . methocarbamol (ROBAXIN) 500 MG tablet Take 500 mg by mouth every 8 (eight) hours as needed for muscle spasms.    . Multiple Vitamin (MULTIVITAMIN) tablet Take 1 tablet by mouth daily.      . ondansetron (ZOFRAN) 4  MG tablet Take 4 mg by mouth every 6 (six) hours as needed for nausea or vomiting.    . promethazine (PHENERGAN) 25 MG tablet Take 1 tablet (25 mg total) by mouth every 6 (six) hours as needed for nausea or vomiting. 6 tablet 0  . rosuvastatin (CRESTOR) 20 MG tablet Take 1 tablet (20 mg total) by mouth daily. 90 tablet 3   No current facility-administered medications for this visit.   Allergies: Allergies  Allergen Reactions  . Rizatriptan Other (See Comments)    hypersomnolence hypersomnolence  . Flulaval Quadrivalent [Influenza Vac Split Quad]   . Latex Itching, Rash and Other (See Comments)    Prolonged exposure Prolonged exposure Other Reaction: Other reaction    Social History: Social History   Socioeconomic History  . Marital status: Legally Separated    Spouse name: Not on file  . Number of children: 1  . Years of education: Not on file  . Highest education level: Not on file  Occupational History  . Occupation: Teacher, adult education: [employer]  Tobacco Use  . Smoking status: Never Smoker  . Smokeless tobacco: Never Used  Vaping Use  . Vaping Use: Never used  Substance and Sexual Activity  . Alcohol use: Yes    Comment: occasionally  . Drug use: No  . Sexual activity: Yes    Partners: Male    Birth control/protection: None  Other Topics Concern  . Not on file  Social History Narrative  . Not on file   Social Determinants of Health   Financial Resource Strain:   . Difficulty of Paying Living Expenses: Not on file  Food Insecurity:   . Worried About Programme researcher, broadcasting/film/video in the Last Year: Not on file  . Ran Out of Food in the Last Year: Not on file  Transportation Needs:   . Lack of Transportation (Medical): Not on file  .  Lack of Transportation (Non-Medical): Not on file  Physical Activity:   . Days of Exercise per Week: Not on file  . Minutes of Exercise per Session: Not on file  Stress:   . Feeling of Stress : Not on file  Social Connections:   . Frequency of Communication with Friends and Family: Not on file  . Frequency of Social Gatherings with Friends and Family: Not on file  . Attends Religious Services: Not on file  . Active Member of Clubs or Organizations: Not on file  . Attends Banker Meetings: Not on file  . Marital Status: Not on file   Lives in an apartment. Smoking: denies Occupation: Charity fundraiser in [employer], broadcasting/film/video HistorySurveyor, minerals in the house: no Carpet in the family room: no Carpet in the bedroom: no Heating: heat pump Cooling: heat pump Pet: yes 1 cat and 2 dogs  Family History: Family History  Problem Relation Age of Onset  . Hypertension Mother   . Hyperlipidemia Father   . Hypertension Father   . Dementia Maternal Grandmother   . Diabetes Paternal Grandmother   . Depression Paternal Grandfather        suicide  . Heart attack Maternal Aunt   . Colon cancer Neg Hx   . Esophageal cancer Neg Hx   . Stomach cancer Neg Hx   . Rectal cancer Neg Hx   . Breast cancer Neg Hx    Problem  Relation Asthma                                   No  Eczema                                No  Food allergy                          No  Allergic rhino conjunctivitis     No   Review of Systems  Constitutional: Negative for appetite change, chills, fever and unexpected weight change.  HENT: Negative for congestion and rhinorrhea.   Eyes: Negative for itching.  Respiratory: Negative for cough, chest tightness, shortness of breath and wheezing.   Cardiovascular: Negative for chest pain.  Gastrointestinal: Negative for abdominal pain.  Genitourinary: Negative for difficulty urinating.  Skin: Negative for rash.  Neurological: Negative  for headaches.   Objective: BP 130/80   Pulse 73   Temp (!) 97.4 F (36.3 C) (Temporal)   Resp 17   Ht 5' 2.75" (1.594 m)   Wt 209 lb (94.8 kg)   SpO2 98%   BMI 37.32 kg/m  Body mass index is 37.32 kg/m. Physical Exam Vitals and nursing note reviewed.  Constitutional:      Appearance: Normal appearance. She is well-developed.  HENT:     Head: Normocephalic and atraumatic.     Right Ear: External ear normal.     Left Ear: External ear normal.     Nose: Nose normal.     Mouth/Throat:     Mouth: Mucous membranes are moist.     Pharynx: Oropharynx is clear.  Eyes:     Conjunctiva/sclera: Conjunctivae normal.  Cardiovascular:     Rate and Rhythm: Normal rate and regular rhythm.     Heart sounds: Normal heart sounds. No murmur heard.  No friction rub. No gallop.   Pulmonary:     Effort: Pulmonary effort is normal.     Breath sounds: Normal breath sounds. No wheezing, rhonchi or rales.  Abdominal:     Palpations: Abdomen is soft.  Musculoskeletal:     Cervical back: Neck supple.  Skin:    General: Skin is warm.     Findings: No rash.  Neurological:     Mental Status: She is alert and oriented to person, place, and time.  Psychiatric:        Behavior: Behavior normal.    The plan was reviewed with the patient/family, and all questions/concerned were addressed.  It was my pleasure to see Ashley Chang today and participate in her care. Please feel free to contact me with any questions or concerns.  Sincerely,  Wyline Mood, DO Allergy & Immunology  [facility] office: [phone] [facility] office: [phone]

## 2020-07-13 NOTE — Patient Instructions (Signed)
Medication Instructions:  Your physician recommends that you continue on your current medications as directed. Please refer to the Current Medication list given to you today.  *If you need a refill on your cardiac medications before your next appointment, please call your pharmacy*  Lab Work: Your physician recommends that you return for lab work on 07/15/20  Testing/Procedures: None ordered today  Follow-Up: At [facility], you and your health needs are our priority.  As part of our continuing mission to provide you with exceptional heart care, we have created designated Provider Care Teams.  These Care Teams include your primary Cardiologist (physician) and Advanced Practice Providers (APPs -  Physician Assistants and Nurse Practitioners) who all work together to provide you with the care you need, when you need it.  Your next appointment:   6 month(s)  The format for your next appointment:   In Person  Provider:   Kristeen Miss, MD

## 2020-07-13 NOTE — Telephone Encounter (Signed)
  Patient Consent for Virtual Visit         Ashley Chang has provided verbal consent on 07/13/2020 for a virtual visit (video or telephone).   CONSENT FOR VIRTUAL VISIT FOR:  Ashley Chang  By participating in this virtual visit I agree to the following:  I hereby voluntarily request, consent and authorize [facility] and its employed or contracted physicians, physician assistants, nurse practitioners or other licensed health care professionals (the Practitioner), to provide me with telemedicine health care services (the "Services") as deemed necessary by the treating Practitioner. I acknowledge and consent to receive the Services by the Practitioner via telemedicine. I understand that the telemedicine visit will involve communicating with the Practitioner through live audiovisual communication technology and the disclosure of certain medical information by electronic transmission. I acknowledge that I have been given the opportunity to request an in-person assessment or other available alternative prior to the telemedicine visit and am voluntarily participating in the telemedicine visit.  I understand that I have the right to withhold or withdraw my consent to the use of telemedicine in the course of my care at any time, without affecting my right to future care or treatment, and that the Practitioner or I may terminate the telemedicine visit at any time. I understand that I have the right to inspect all information obtained and/or recorded in the course of the telemedicine visit and may receive copies of available information for a reasonable fee.  I understand that some of the potential risks of receiving the Services via telemedicine include:  Marland Kitchen Delay or interruption in medical evaluation due to technological equipment failure or disruption; . Information transmitted may not be sufficient (e.g. poor resolution of images) to allow for appropriate medical decision making by the  Practitioner; and/or  . In rare instances, security protocols could fail, causing a breach of personal health information.  Furthermore, I acknowledge that it is my responsibility to provide information about my medical history, conditions and care that is complete and accurate to the best of my ability. I acknowledge that Practitioner's advice, recommendations, and/or decision may be based on factors not within their control, such as incomplete or inaccurate data provided by me or distortions of diagnostic images or specimens that may result from electronic transmissions. I understand that the practice of medicine is not an exact science and that Practitioner makes no warranties or guarantees regarding treatment outcomes. I acknowledge that a copy of this consent can be made available to me via my patient portal [facility] MyChart), or I can request a printed copy by calling the office of [facility].    I understand that my insurance will be billed for this visit.   I have read or had this consent read to me. . I understand the contents of this consent, which adequately explains the benefits and risks of the Services being provided via telemedicine.  . I have been provided ample opportunity to ask questions regarding this consent and the Services and have had my questions answered to my satisfaction. . I give my informed consent for the services to be provided through the use of telemedicine in my medical care

## 2020-07-14 ENCOUNTER — Encounter: Payer: Self-pay | Admitting: Allergy

## 2020-07-14 ENCOUNTER — Ambulatory Visit (INDEPENDENT_AMBULATORY_CARE_PROVIDER_SITE_OTHER): Payer: 59 | Admitting: Allergy

## 2020-07-14 VITALS — BP 130/80 | HR 73 | Temp 97.4°F | Resp 17 | Ht 62.75 in | Wt 209.0 lb

## 2020-07-14 DIAGNOSIS — T50B95D Adverse effect of other viral vaccines, subsequent encounter: Secondary | ICD-10-CM | POA: Insufficient documentation

## 2020-07-14 DIAGNOSIS — Z887 Allergy status to serum and vaccine status: Secondary | ICD-10-CM

## 2020-07-14 DIAGNOSIS — J4520 Mild intermittent asthma, uncomplicated: Secondary | ICD-10-CM

## 2020-07-14 DIAGNOSIS — T50905A Adverse effect of unspecified drugs, medicaments and biological substances, initial encounter: Secondary | ICD-10-CM | POA: Diagnosis not present

## 2020-07-14 NOTE — Assessment & Plan Note (Signed)
Uses albuterol once per week when she's around her horses/dusty areas with good benefit.  Today's spirometry was normal today.  May use albuterol rescue inhaler 2 puffs every 4 to 6 hours as needed for shortness of breath, chest tightness, coughing, and wheezing. Monitor frequency of use.

## 2020-07-14 NOTE — Patient Instructions (Addendum)
   Today's skin prick and intradermal testing was negative to Miralax (source of PEG 3350), methylprednisolone acetate (source of PEG 3350), and triamcinolone acetonide (source of polysorbate-80).  You were given some oral Miralax which may cause some diarrhea/soft stools.    Based on the above test results, do you do not meet criteria for medical exemption of the COVID-19 vaccine from allergy standpoint.   Give your clinical history, recommend MRNA based vaccine with either Pfizer or Moderna.   Reactions to flu vaccine or Tdap is NOT a contraindication to receive the MRNA based vaccines.  It sounds like you had a robust immune response to these vaccines in the past.    If you feel more comfortable, we can give you the vaccine in our office.  Our next vaccine clinic day is on 07/21/20.  Wait 30 minutes after the vaccine in the clinic.    If you have concerns about the vaccine and your history of epilepsy, please follow up with your neurologist regarding this.   Your breathing test was normal today.  May use albuterol rescue inhaler 2 puffs every 4 to 6 hours as needed for shortness of breath, chest tightness, coughing, and wheezing. Monitor frequency of use.   Follow up as needed.

## 2020-07-14 NOTE — Assessment & Plan Note (Addendum)
Patient has history of large localized swelling with flu vaccines to the point of having to cut off her ring. Some pain with Tdap. Has exemption letter for flu vaccines. Denies any other medication allergies. Tolerates eggs with no issues. History of epilepsy with no known triggers and blood clots .  Discussed with patient that given clinical history, she can receive either Pfizer or Moderna MRNA vaccine and component testing is not necessary.  Patient prefers to get component testing and results showed:   Today's skin prick and intradermal testing was negative to Miralax (source of PEG 3350), methylprednisolone acetate (source of PEG 3350), and triamcinolone acetonide (source of polysorbate-80).  Tolerated oral Miralax challenge with no issues.   Based on the above test results, patient does not meet criteria for medical exemption of the COVID-19 vaccine from allergy standpoint.   Give clinical history, recommend MRNA based vaccine with either Pfizer or Moderna.   Reactions to flu vaccine or Tdap is NOT a contraindication to receive the MRNA based vaccines.  She most likely had a robust immune response to these vaccines in the past and not an allergic reaction.   Offered to administer COVID-19 vaccine in our clinic and patient will call back to schedule. Wait 30 minutes in the clinic after administration.   Advised patient to follow up with neurologist regarding her concerns about the vaccine and her epilepsy history.

## 2020-07-14 NOTE — Addendum Note (Signed)
Addended by: Dub Mikes on: 07/14/2020 01:37 PM   Modules accepted: Orders

## 2020-07-15 ENCOUNTER — Telehealth: Payer: 59 | Admitting: Physician Assistant

## 2020-07-15 ENCOUNTER — Other Ambulatory Visit: Payer: 59 | Admitting: *Deleted

## 2020-07-15 ENCOUNTER — Other Ambulatory Visit: Payer: Self-pay

## 2020-07-15 DIAGNOSIS — E782 Mixed hyperlipidemia: Secondary | ICD-10-CM

## 2020-07-15 LAB — COMPREHENSIVE METABOLIC PANEL
ALT: 13 IU/L (ref 0–32)
AST: 18 IU/L (ref 0–40)
Albumin/Globulin Ratio: 2.2 (ref 1.2–2.2)
Albumin: 4.6 g/dL (ref 3.8–4.8)
Alkaline Phosphatase: 80 IU/L (ref 44–121)
BUN/Creatinine Ratio: 22 (ref 9–23)
BUN: 18 mg/dL (ref 6–24)
Bilirubin Total: 0.5 mg/dL (ref 0.0–1.2)
CO2: 22 mmol/L (ref 20–29)
Calcium: 9.1 mg/dL (ref 8.7–10.2)
Chloride: 103 mmol/L (ref 96–106)
Creatinine, Ser: 0.82 mg/dL (ref 0.57–1.00)
GFR calc Af Amer: 102 mL/min/{1.73_m2} (ref >59)
GFR calc non Af Amer: 89 mL/min/{1.73_m2} (ref >59)
Globulin, Total: 2.1 g/dL (ref 1.5–4.5)
Glucose: 89 mg/dL (ref 65–99)
Potassium: 4.4 mmol/L (ref 3.5–5.2)
Sodium: 139 mmol/L (ref 134–144)
Total Protein: 6.7 g/dL (ref 6.0–8.5)

## 2020-07-15 LAB — LIPID PANEL
Chol/HDL Ratio: 4.8 ratio — ABNORMAL HIGH (ref 0.0–4.4)
Cholesterol, Total: 254 mg/dL — ABNORMAL HIGH (ref 100–199)
HDL: 53 mg/dL (ref >39)
LDL Chol Calc (NIH): 180 mg/dL — ABNORMAL HIGH (ref 0–99)
Triglycerides: 118 mg/dL (ref 0–149)
VLDL Cholesterol Cal: 21 mg/dL (ref 5–40)

## 2020-07-29 ENCOUNTER — Other Ambulatory Visit: Payer: Self-pay

## 2020-07-29 DIAGNOSIS — E782 Mixed hyperlipidemia: Secondary | ICD-10-CM

## 2020-07-29 DIAGNOSIS — I2584 Coronary atherosclerosis due to calcified coronary lesion: Secondary | ICD-10-CM

## 2020-07-29 MED ORDER — ROSUVASTATIN CALCIUM 40 MG PO TABS: 40.0000 mg | ORAL_TABLET | Freq: Every day | ORAL | 3 refills | Status: AC

## 2020-10-28 ENCOUNTER — Other Ambulatory Visit: Payer: 59

## 2022-02-02 ENCOUNTER — Telehealth: Payer: Self-pay | Admitting: Cardiovascular Disease

## 2022-02-02 NOTE — Telephone Encounter (Signed)
Patient states she has moved out of state and is looking for another cardiologist.
